# Patient Record
Sex: Female | Born: 1985 | Race: Black or African American | Hispanic: No | Marital: Married | Smoking: Current some day smoker
Health system: Southern US, Community
[De-identification: ages and names within clinical notes are randomized; demographics above are authoritative.]

## PROBLEM LIST (undated history)

## (undated) DIAGNOSIS — Z8041 Family history of malignant neoplasm of ovary: Secondary | ICD-10-CM

## (undated) DIAGNOSIS — Z803 Family history of malignant neoplasm of breast: Secondary | ICD-10-CM

## (undated) DIAGNOSIS — J45909 Unspecified asthma, uncomplicated: Secondary | ICD-10-CM

## (undated) HISTORY — DX: Family history of malignant neoplasm of breast: Z80.3

## (undated) HISTORY — DX: Unspecified asthma, uncomplicated: J45.909

## (undated) HISTORY — PX: TUBAL LIGATION: SHX77

## (undated) HISTORY — PX: LEEP: SHX91

## (undated) HISTORY — DX: Family history of malignant neoplasm of ovary: Z80.41

---

## 2012-11-10 DIAGNOSIS — D649 Anemia, unspecified: Secondary | ICD-10-CM | POA: Insufficient documentation

## 2012-11-10 DIAGNOSIS — E559 Vitamin D deficiency, unspecified: Secondary | ICD-10-CM | POA: Insufficient documentation

## 2015-03-13 DIAGNOSIS — G8929 Other chronic pain: Secondary | ICD-10-CM | POA: Insufficient documentation

## 2015-03-13 DIAGNOSIS — M546 Pain in thoracic spine: Secondary | ICD-10-CM | POA: Insufficient documentation

## 2019-04-09 ENCOUNTER — Other Ambulatory Visit: Payer: Self-pay

## 2019-04-09 ENCOUNTER — Encounter: Payer: Self-pay | Admitting: Physician Assistant

## 2019-04-09 ENCOUNTER — Ambulatory Visit (INDEPENDENT_AMBULATORY_CARE_PROVIDER_SITE_OTHER): Payer: No Typology Code available for payment source | Admitting: Physician Assistant

## 2019-04-09 VITALS — BP 123/84 | HR 74 | Temp 96.9°F | Ht 63.0 in | Wt 202.0 lb

## 2019-04-09 DIAGNOSIS — R12 Heartburn: Secondary | ICD-10-CM | POA: Diagnosis not present

## 2019-04-09 DIAGNOSIS — O3440 Maternal care for other abnormalities of cervix, unspecified trimester: Secondary | ICD-10-CM

## 2019-04-09 DIAGNOSIS — Z803 Family history of malignant neoplasm of breast: Secondary | ICD-10-CM

## 2019-04-09 DIAGNOSIS — F329 Major depressive disorder, single episode, unspecified: Secondary | ICD-10-CM | POA: Diagnosis not present

## 2019-04-09 DIAGNOSIS — Z9889 Other specified postprocedural states: Secondary | ICD-10-CM

## 2019-04-09 DIAGNOSIS — D649 Anemia, unspecified: Secondary | ICD-10-CM

## 2019-04-09 DIAGNOSIS — F32A Depression, unspecified: Secondary | ICD-10-CM

## 2019-04-09 DIAGNOSIS — Z13220 Encounter for screening for lipoid disorders: Secondary | ICD-10-CM

## 2019-04-09 DIAGNOSIS — R002 Palpitations: Secondary | ICD-10-CM

## 2019-04-09 NOTE — Progress Notes (Signed)
Patient: Desiree RumbleDanica Collins, Female    DOB: May 14, 1985, 33 y.o.   MRN: [id number] Visit Date: 04/15/2019  Today's Provider: Trey SailorsAdriana M Rhina Kramme, PA-C   Chief Complaint  Patient presents with  . Establish Care  . Gastroesophageal Reflux   Subjective:     Establish Care Desiree MangoDanica Laural BenesJohnson is a 33 y.o. female who presents today to establish. She feels fairly well. She reports exercising. She reports she is sleeping poorly.  Recently moving from [place], [place]. Lives with three children and works for [employer].   Patient reports a history of abnormal heartbeats for about 5 years ago. Nearly 3 years ago she had a loop recorder with cardiologist. She had tilt table test which was not revealing. Couldn't find out what was causing it, she was placed on metoprolol succinate and diltiazem which patient reported dropped her BP too low and she felt dizziness with this.   GERD:  Taking Nexium OTC, seems to be helping some.  Drinking 2 drinks most nights, mixture of vodka and tequila. PAP smear last year in [place] which she thinks was slightly abnormal and needed repeating this year. She had a LEEP at 16. She would like an IUD.   Depression: Reports history of depression diagnosed several years ago. She was prescribed a medication she cannot remember and took this for two days but discontinued because she didn't like the side effects. Thinks this may be influencing her drinking habits.     Office Visit from 04/09/2019 in [facility]  PHQ-9 Total Score  11      -----------------------------------------------------------------   Recently from [place] moved here three months ago.    Review of Systems  Constitutional: Positive for diaphoresis, fatigue, fever and unexpected weight change. Negative for activity change, appetite change and chills.  HENT: Positive for congestion and sinus pressure. Negative for dental problem, drooling, ear discharge, ear pain, facial swelling,  hearing loss, mouth sores, nosebleeds, postnasal drip, rhinorrhea, sinus pain, sneezing, sore throat, tinnitus, trouble swallowing and voice change.   Eyes: Negative.   Respiratory: Negative.   Cardiovascular: Positive for palpitations. Negative for chest pain and leg swelling.  Gastrointestinal: Negative.   Endocrine: Positive for polydipsia. Negative for cold intolerance, heat intolerance, polyphagia and polyuria.  Genitourinary: Negative.   Musculoskeletal: Positive for back pain. Negative for arthralgias, gait problem, joint swelling, myalgias, neck pain and neck stiffness.  Skin: Negative.   Allergic/Immunologic: Negative.   Neurological: Positive for headaches. Negative for dizziness, tremors, seizures, syncope, facial asymmetry, speech difficulty, weakness, light-headedness and numbness.  Hematological: Negative.   Psychiatric/Behavioral: Positive for decreased concentration. Negative for agitation, behavioral problems, confusion, dysphoric mood, hallucinations, self-injury, sleep disturbance and suicidal ideas. The patient is not nervous/anxious and is not hyperactive.     Social History      She  reports that she has never smoked. She has never used smokeless tobacco. She reports current alcohol use. She reports that she does not use drugs.       Social History   Socioeconomic History  . Marital status: Married    Spouse name: Not on file  . Number of children: Not on file  . Years of education: Not on file  . Highest education level: Not on file  Occupational History  . Not on file  Tobacco Use  . Smoking status: Never Smoker  . Smokeless tobacco: Never Used  Substance and Sexual Activity  . Alcohol use: Yes  . Drug use: Never  . Sexual  activity: Not on file  Other Topics Concern  . Not on file  Social History Narrative  . Not on file   Social Determinants of Health   Financial Resource Strain:   . Difficulty of Paying Living Expenses: Not on file  Food  Insecurity:   . Worried About Programme researcher, broadcasting/film/video in the Last Year: Not on file  . Ran Out of Food in the Last Year: Not on file  Transportation Needs:   . Lack of Transportation (Medical): Not on file  . Lack of Transportation (Non-Medical): Not on file  Physical Activity:   . Days of Exercise per Week: Not on file  . Minutes of Exercise per Session: Not on file  Stress:   . Feeling of Stress : Not on file  Social Connections:   . Frequency of Communication with Friends and Family: Not on file  . Frequency of Social Gatherings with Friends and Family: Not on file  . Attends Religious Services: Not on file  . Active Member of Clubs or Organizations: Not on file  . Attends Banker Meetings: Not on file  . Marital Status: Not on file    Past Medical History:  Diagnosis Date  . Asthma      Patient Active Problem List   Diagnosis Date Noted  . Palpitations 04/12/2019    Past Surgical History:  Procedure Laterality Date  . LEEP    . TUBAL LIGATION      Family History        Family Status  Relation Name Status  . Mother  Alive  . Father  (Not Specified)  . MGM  Alive  . MGF  Deceased  . Mat Aunt  Alive        Her family history includes Bone cancer in her maternal grandfather; Breast cancer in her maternal aunt, maternal grandmother, and mother; Cervical cancer in her maternal grandmother; Diabetes in her mother; Hypertension in her father; Uterine cancer in her mother.      Allergies  Allergen Reactions  . Chocolate Hives  . Morphine Hives     Current Outpatient Medications:  .  esomeprazole (NEXIUM) 20 MG capsule, Take 20 mg by mouth daily at 12 noon., Disp: , Rfl:    Patient Care Team: Maryella Shivers as PCP - General (Physician Assistant)    Objective:    Vitals: BP 123/84 (BP Location: Left Arm, Patient Position: Sitting, Cuff Size: Large)   Pulse 74   Temp (!) 96.9 F (36.1 C) (Temporal)   Ht 5\' 3"  (1.6 m)   Wt 202 lb (91.6  kg)   BMI 35.78 kg/m    Vitals:   04/09/19 1015  BP: 123/84  Pulse: 74  Temp: (!) 96.9 F (36.1 C)  TempSrc: Temporal  Weight: 202 lb (91.6 kg)  Height: 5\' 3"  (1.6 m)     Physical Exam Constitutional:      Appearance: Normal appearance.  Cardiovascular:     Rate and Rhythm: Normal rate and regular rhythm.     Heart sounds: Normal heart sounds.  Pulmonary:     Effort: Pulmonary effort is normal.     Breath sounds: Normal breath sounds.  Skin:    General: Skin is warm and dry.  Neurological:     Mental Status: She is alert and oriented to person, place, and time. Mental status is at baseline.  Psychiatric:        Mood and Affect: Mood normal.  Behavior: Behavior normal.      Depression Screen PHQ 2/9 Scores 04/09/2019  PHQ - 2 Score 3  PHQ- 9 Score 11       Assessment & Plan:     Routine Health Maintenance and Physical Exam  Exercise Activities and Dietary recommendations Goals   None      There is no immunization history on file for this patient.  Health Maintenance  Topic Date Due  . HIV Screening  09/07/1998  . TETANUS/TDAP  09/07/2002  . PAP SMEAR-Modifier  09/06/2004  . INFLUENZA VACCINE  07/28/2019 (Originally 11/28/2018)     Discussed health benefits of physical activity, and encouraged her to engage in regular exercise appropriate for her age and condition.    1. History of loop electrosurgical excision procedure (LEEP) of cervix affecting pregnancy, antepartum  Refer for follow up PAP, IUD an for strong family history of breast cancer.   - Ambulatory referral to Obstetrics / Gynecology  2. Family history of breast cancer  - Ambulatory referral to Obstetrics / Gynecology  3. Depression, unspecified depression type  Patient does not desire treatment or counseling at this time.   4. Heartburn  Avoid triggers and continue nexium.  5. Anemia, unspecified type  - CBC with Differential - TSH - Comprehensive Metabolic Panel  (CMET) - Lipid Profile  6. Screening, lipid  - Lipid Profile  7. Palpitations  Counseled on general nature of palpitations. Counseled that blood pressure medications are given to control heart rate. Patient seems to have had extensive workup previously with no findings. Patient would like referral to cardiology. Counseled patient that there may no be additional findings and recommendations are likely to be beta blockers and calcium channel blockers. Patient prefers to proceed with referral.  - Ambulatory referral to Cardiology  The entirety of the information documented in the History of Present Illness, Review of Systems and Physical Exam were personally obtained by me. Portions of this information were initially documented by [facility] and reviewed by me for thoroughness and accuracy.   --------------------------------------------------------------------    Trinna Post, PA-C  [facility]

## 2019-04-09 NOTE — Patient Instructions (Signed)
Health Maintenance, Female Adopting a healthy lifestyle and getting preventive care are important in promoting health and wellness. Ask your health care provider about:  The right schedule for you to have regular tests and exams.  Things you can do on your own to prevent diseases and keep yourself healthy. What should I know about diet, weight, and exercise? Eat a healthy diet   Eat a diet that includes plenty of vegetables, fruits, low-fat dairy products, and lean protein.  Do not eat a lot of foods that are high in solid fats, added sugars, or sodium. Maintain a healthy weight Body mass index (BMI) is used to identify weight problems. It estimates body fat based on height and weight. Your health care provider can help determine your BMI and help you achieve or maintain a healthy weight. Get regular exercise Get regular exercise. This is one of the most important things you can do for your health. Most adults should:  Exercise for at least 150 minutes each week. The exercise should increase your heart rate and make you sweat (moderate-intensity exercise).  Do strengthening exercises at least twice a week. This is in addition to the moderate-intensity exercise.  Spend less time sitting. Even light physical activity can be beneficial. Watch cholesterol and blood lipids Have your blood tested for lipids and cholesterol at 35 years of age, then have this test every 5 years. Have your cholesterol levels checked more often if:  Your lipid or cholesterol levels are high.  You are older than 35 years of age.  You are at high risk for heart disease. What should I know about cancer screening? Depending on your health history and family history, you may need to have cancer screening at various ages. This may include screening for:  Breast cancer.  Cervical cancer.  Colorectal cancer.  Skin cancer.  Lung cancer. What should I know about heart disease, diabetes, and high blood  pressure? Blood pressure and heart disease  High blood pressure causes heart disease and increases the risk of stroke. This is more likely to develop in people who have high blood pressure readings, are of African descent, or are overweight.  Have your blood pressure checked: ? Every 3-5 years if you are 18-39 years of age. ? Every year if you are 40 years old or older. Diabetes Have regular diabetes screenings. This checks your fasting blood sugar level. Have the screening done:  Once every three years after age 40 if you are at a normal weight and have a low risk for diabetes.  More often and at a younger age if you are overweight or have a high risk for diabetes. What should I know about preventing infection? Hepatitis B If you have a higher risk for hepatitis B, you should be screened for this virus. Talk with your health care provider to find out if you are at risk for hepatitis B infection. Hepatitis C Testing is recommended for:  Everyone born from 1945 through 1965.  Anyone with known risk factors for hepatitis C. Sexually transmitted infections (STIs)  Get screened for STIs, including gonorrhea and chlamydia, if: ? You are sexually active and are younger than 35 years of age. ? You are older than 35 years of age and your health care provider tells you that you are at risk for this type of infection. ? Your sexual activity has changed since you were last screened, and you are at increased risk for chlamydia or gonorrhea. Ask your health care provider if   you are at risk.  Ask your health care provider about whether you are at high risk for HIV. Your health care provider may recommend a prescription medicine to help prevent HIV infection. If you choose to take medicine to prevent HIV, you should first get tested for HIV. You should then be tested every 3 months for as long as you are taking the medicine. Pregnancy  If you are about to stop having your period (premenopausal) and  you may become pregnant, seek counseling before you get pregnant.  Take 400 to 800 micrograms (mcg) of folic acid every day if you become pregnant.  Ask for birth control (contraception) if you want to prevent pregnancy. Osteoporosis and menopause Osteoporosis is a disease in which the bones lose minerals and strength with aging. This can result in bone fractures. If you are 65 years old or older, or if you are at risk for osteoporosis and fractures, ask your health care provider if you should:  Be screened for bone loss.  Take a calcium or vitamin D supplement to lower your risk of fractures.  Be given hormone replacement therapy (HRT) to treat symptoms of menopause. Follow these instructions at home: Lifestyle  Do not use any products that contain nicotine or tobacco, such as cigarettes, e-cigarettes, and chewing tobacco. If you need help quitting, ask your health care provider.  Do not use street drugs.  Do not share needles.  Ask your health care provider for help if you need support or information about quitting drugs. Alcohol use  Do not drink alcohol if: ? Your health care provider tells you not to drink. ? You are pregnant, may be pregnant, or are planning to become pregnant.  If you drink alcohol: ? Limit how much you use to 0-1 drink a day. ? Limit intake if you are breastfeeding.  Be aware of how much alcohol is in your drink. In the [place] one drink equals one 12 oz bottle of beer (355 mL), one 5 oz glass of wine (148 mL), or one 1 oz glass of hard liquor (44 mL). General instructions  Schedule regular health, dental, and eye exams.  Stay current with your vaccines.  Tell your health care provider if: ? You often feel depressed. ? You have ever been abused or do not feel safe at home. Summary  Adopting a healthy lifestyle and getting preventive care are important in promoting health and wellness.  Follow your health care provider's instructions about healthy  diet, exercising, and getting tested or screened for diseases.  Follow your health care provider's instructions on monitoring your cholesterol and blood pressure. This information is not intended to replace advice given to you by your health care provider. Make sure you discuss any questions you have with your health care provider. Document Released: 10/29/2010 Document Revised: 04/08/2018 Document Reviewed: 04/08/2018 Elsevier Patient Education  2020 Elsevier Inc.  

## 2019-04-10 LAB — COMPREHENSIVE METABOLIC PANEL
ALT: 18 IU/L (ref 0–32)
AST: 20 IU/L (ref 0–40)
Albumin/Globulin Ratio: 2 (ref 1.2–2.2)
Albumin: 4.1 g/dL (ref 3.8–4.8)
Alkaline Phosphatase: 56 IU/L (ref 39–117)
BUN/Creatinine Ratio: 15 (ref 9–23)
BUN: 13 mg/dL (ref 6–20)
Bilirubin Total: 0.3 mg/dL (ref 0.0–1.2)
CO2: 24 mmol/L (ref 20–29)
Calcium: 9 mg/dL (ref 8.7–10.2)
Chloride: 105 mmol/L (ref 96–106)
Creatinine, Ser: 0.84 mg/dL (ref 0.57–1.00)
GFR calc Af Amer: 104 mL/min/{1.73_m2} (ref >59)
GFR calc non Af Amer: 90 mL/min/{1.73_m2} (ref >59)
Globulin, Total: 2.1 g/dL (ref 1.5–4.5)
Glucose: 88 mg/dL (ref 65–99)
Potassium: 4.2 mmol/L (ref 3.5–5.2)
Sodium: 138 mmol/L (ref 134–144)
Total Protein: 6.2 g/dL (ref 6.0–8.5)

## 2019-04-10 LAB — CBC WITH DIFFERENTIAL/PLATELET
Basophils Absolute: 0 10*3/uL (ref 0.0–0.2)
Basos: 1 %
EOS (ABSOLUTE): 0.1 10*3/uL (ref 0.0–0.4)
Eos: 2 %
Hematocrit: 36.1 % (ref 34.0–46.6)
Hemoglobin: 11.5 g/dL (ref 11.1–15.9)
Immature Grans (Abs): 0 10*3/uL (ref 0.0–0.1)
Immature Granulocytes: 0 %
Lymphocytes Absolute: 1.8 10*3/uL (ref 0.7–3.1)
Lymphs: 33 %
MCH: 27.2 pg (ref 26.6–33.0)
MCHC: 31.9 g/dL (ref 31.5–35.7)
MCV: 85 fL (ref 79–97)
Monocytes Absolute: 0.4 10*3/uL (ref 0.1–0.9)
Monocytes: 8 %
Neutrophils Absolute: 3 10*3/uL (ref 1.4–7.0)
Neutrophils: 56 %
Platelets: 339 10*3/uL (ref 150–450)
RBC: 4.23 x10E6/uL (ref 3.77–5.28)
RDW: 14.8 % (ref 11.7–15.4)
WBC: 5.4 10*3/uL (ref 3.4–10.8)

## 2019-04-10 LAB — LIPID PANEL
Chol/HDL Ratio: 3.6 ratio (ref 0.0–4.4)
Cholesterol, Total: 203 mg/dL — ABNORMAL HIGH (ref 100–199)
HDL: 56 mg/dL (ref >39)
LDL Chol Calc (NIH): 132 mg/dL — ABNORMAL HIGH (ref 0–99)
Triglycerides: 81 mg/dL (ref 0–149)
VLDL Cholesterol Cal: 15 mg/dL (ref 5–40)

## 2019-04-10 LAB — TSH: TSH: 1.21 u[IU]/mL (ref 0.450–4.500)

## 2019-04-12 DIAGNOSIS — R002 Palpitations: Secondary | ICD-10-CM | POA: Insufficient documentation

## 2019-04-12 NOTE — Progress Notes (Signed)
Cardiology Office Note  Date:  04/13/2019   ID:  Desiree Collins, DOB Nov 16, 1985, MRN [id number]  PCP:  Trinna Post, PA-C   Chief Complaint  Patient presents with  . New Patient (Initial Visit)    referred by PCP for abnormal Heartbeats. Patient also has a loop recorder. Meds reviewed verbally with patient.     HPI:  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  PSH:    Past Surgical History:  Procedure Laterality Date  . LEEP    . TUBAL LIGATION      Current Outpatient Medications  Medication Sig Dispense Refill  . esomeprazole (NEXIUM) 20 MG capsule Take 20 mg by mouth daily at 12 noon.     No current facility-administered medications for this visit.     Allergies:   Chocolate and Morphine   Social History:  The patient  reports that she has never smoked. She has never used smokeless tobacco. She reports current alcohol use. She reports that she does not use drugs.   Family History:   family history includes Bone cancer in her  maternal grandfather; Breast cancer in her maternal aunt, maternal grandmother, and mother; Cervical cancer in her maternal grandmother; Diabetes in her mother; Hypertension in her father; Uterine cancer in her mother.    Review of Systems: Review of Systems  Constitutional: Negative.   HENT: Negative.   Respiratory: Negative.   Cardiovascular: Positive for palpitations.  Gastrointestinal: Negative.   Musculoskeletal: Negative.   Neurological: Negative.   Psychiatric/Behavioral: Negative.   All other systems reviewed and are negative.   PHYSICAL EXAM: VS:  BP 110/70 (BP Location: Left Arm, Patient Position: Sitting, Cuff Size: Normal)   Pulse 76   Ht 5\' 3"  (1.6 m)   Wt 201 lb (91.2 kg)   SpO2 99%   BMI 35.61 kg/m  , BMI Body mass index is 35.61 kg/m. GEN: Well nourished, well developed, in no acute distress HEENT: normal Neck: no JVD, carotid bruits, or masses Cardiac: RRR; no murmurs, rubs, or gallops,no edema  Respiratory:  clear to auscultation bilaterally, normal work of breathing GI: soft, nontender, nondistended, + BS MS: no deformity or atrophy Skin: warm and dry, no rash Neuro:  Strength and sensation are intact Psych: euthymic mood, full affect   Recent Labs: 04/09/2019: ALT 18; BUN 13; Creatinine, Ser 0.84; Hemoglobin 11.5; Platelets 339; Potassium 4.2; Sodium 138; TSH 1.210    Lipid Panel Lab Results  Component Value Date   CHOL 203 (H) 04/09/2019   HDL 56 04/09/2019   LDLCALC 132 (H) 04/09/2019  TRIG 81 04/09/2019      Wt Readings from Last 3 Encounters:  04/13/19 201 lb (91.2 kg)  04/09/19 202 lb (91.6 kg)     ASSESSMENT AND PLAN:  Problem List Items Addressed This Visit    Palpitations   Relevant Orders   EKG 12-Lead   LONG TERM MONITOR (3-14 DAYS)     Episodes of her short tachycardia with strong beats is unclear Unable to exclude short runs of atrial tachycardia or SVT, PACs or PVCs Recommended a Zio monitor for further  clarification Discussed various treatment options with her including propranolol as needed, low-dose metoprolol on a daily basis, other beta-blockers, even calcium channel blockers -Would hope not to advance to antiarrhythmics if possible Blood pressure is low which may limit treatment options  We will try to obtain prior records for our system Disposition:   F/U  She preferred to be called with the results of her monitor Rather than a scheduled visit   Patient seen in consultation for Osvaldo Angst be referred back to her office for ongoing care of the issues detailed above   Total encounter time more than 60 minutes  Greater than 50% was spent in counseling and coordination of care with the patient    Signed, Dossie Arbour, M.D., Ph.D. [facility] [place], [place] [phone]

## 2019-04-13 ENCOUNTER — Telehealth: Payer: Self-pay

## 2019-04-13 ENCOUNTER — Ambulatory Visit (INDEPENDENT_AMBULATORY_CARE_PROVIDER_SITE_OTHER): Payer: No Typology Code available for payment source | Admitting: Cardiovascular Disease

## 2019-04-13 ENCOUNTER — Other Ambulatory Visit: Payer: Self-pay

## 2019-04-13 ENCOUNTER — Other Ambulatory Visit: Payer: No Typology Code available for payment source

## 2019-04-13 ENCOUNTER — Encounter: Payer: Self-pay | Admitting: Cardiovascular Disease

## 2019-04-13 VITALS — BP 110/70 | HR 76 | Ht 63.0 in | Wt 201.0 lb

## 2019-04-13 DIAGNOSIS — R002 Palpitations: Secondary | ICD-10-CM

## 2019-04-13 DIAGNOSIS — I479 Paroxysmal tachycardia, unspecified: Secondary | ICD-10-CM | POA: Diagnosis not present

## 2019-04-13 NOTE — Telephone Encounter (Signed)
-----   Message from Trinna Post, [place] sent at 04/12/2019  1:28 PM EST ----- Labwork all looks stable. Please have her make [place] and send PAP smear results when she is able.

## 2019-04-13 NOTE — Patient Instructions (Signed)
Zio for paroxysmal tachycardia, palpitations  Medication Instructions:  No changes  If you need a refill on your cardiac medications before your next appointment, please call your pharmacy.    Lab work: No new labs needed   If you have labs (blood work) drawn today and your tests are completely normal, you will receive your results only by: Marland Kitchen MyChart Message (if you have MyChart) OR . A paper copy in the mail If you have any lab test that is abnormal or we need to change your treatment, we will call you to review the results.   Testing/Procedures: No new testing needed   Follow-Up: At [facility], you and your health needs are our priority.  As part of our continuing mission to provide you with exceptional heart care, we have created designated Provider Care Teams.  These Care Teams include your primary Cardiologist (physician) and Advanced Practice Providers (APPs -  Physician Assistants and Nurse Practitioners) who all work together to provide you with the care you need, when you need it.  . You will need a follow up appointment as needed . We will call you with results   . Providers on your designated Care Team:   . Murray Hodgkins, NP . Christell Faith, PA-C . Marrianne Mood, PA-C  Any Other Special Instructions Will Be Listed Below (If Applicable).  For educational health videos Log in to : [email or web address] Or : [email or web address], password : [email or web address]

## 2019-04-13 NOTE — Telephone Encounter (Signed)
Pt advised.   Thanks,   -Lanesha Azzaro  

## 2019-04-13 NOTE — Telephone Encounter (Signed)
-----   Message from Adriana M Pollak, PA-C sent at 04/12/2019  1:28 PM EST ----- Labwork all looks stable. Please have her make MYChart and send PAP smear results when she is able.  

## 2019-04-15 ENCOUNTER — Encounter: Payer: Self-pay | Admitting: Physician Assistant

## 2019-05-05 ENCOUNTER — Encounter: Payer: No Typology Code available for payment source | Admitting: Obstetrics and Gynecology

## 2019-05-10 ENCOUNTER — Telehealth: Payer: Self-pay

## 2019-05-21 ENCOUNTER — Encounter: Payer: Self-pay | Admitting: Obstetrics and Gynecology

## 2019-05-21 ENCOUNTER — Other Ambulatory Visit: Payer: Self-pay

## 2019-05-21 ENCOUNTER — Other Ambulatory Visit (HOSPITAL_COMMUNITY)
Admission: RE | Admit: 2019-05-21 | Discharge: 2019-05-21 | Disposition: A | Discharge status: Home / Self Care | Payer: No Typology Code available for payment source | Source: Ambulatory Visit | Attending: Obstetrics and Gynecology | Admitting: Obstetrics and Gynecology

## 2019-05-21 ENCOUNTER — Ambulatory Visit (INDEPENDENT_AMBULATORY_CARE_PROVIDER_SITE_OTHER): Payer: No Typology Code available for payment source | Admitting: Obstetrics and Gynecology

## 2019-05-21 VITALS — BP 114/70 | Ht 63.0 in | Wt 208.0 lb

## 2019-05-21 DIAGNOSIS — N926 Irregular menstruation, unspecified: Secondary | ICD-10-CM | POA: Diagnosis not present

## 2019-05-21 DIAGNOSIS — Z124 Encounter for screening for malignant neoplasm of cervix: Secondary | ICD-10-CM

## 2019-05-21 DIAGNOSIS — Z803 Family history of malignant neoplasm of breast: Secondary | ICD-10-CM

## 2019-05-21 NOTE — Patient Instructions (Signed)
° °Breast Cancer, Female ° °Breast cancer is a malignant growth of tissue (tumor) in the breast. Unlike noncancerous (benign) tumors, malignant tumors are cancerous and can spread to other parts of the body. The two most common types of breast cancer start in the milk ducts (ductal carcinoma) or in the lobules where milk is made in the breast (lobular carcinoma). Breast cancer is one of the most common types of cancer in women. °What are the causes? °The exact cause of female breast cancer is unknown. °What increases the risk? °The following factors may make you more likely to develop this condition: °· Being older than 36 years of age. °· Race and ethnicity. Caucasian women generally have an increased risk, but African-American women are more likely to develop the disease before age 45. °· Having a family history of breast cancer. °· Having had breast cancer in the past. °· Having certain noncancerous conditions of the breast, such as dense breast tissue. °· Having the BRCA1 and BRCA2 genes. °· Having a history of radiation exposure. °· Obesity. °· Starting menopause after age 55. °· Starting your menstrual periods before age 12. °· Having never been pregnant or having your first child after age 30. °· Having never breastfed. °· Using hormone therapy after menopause. °· Using birth control pills. °· Drinking more than one alcoholic drink a day. °· Exposure to the drug DES, which was given to pregnant women from the 1940s to the 1970s. °What are the signs or symptoms? °Symptoms of this condition include: °· A painless lump or thickening in your breast. °· Changes in the size or shape of your breast. °· Breast skin changes, such as puckering or dimpling. °· Nipple abnormalities, such as scaling, crustiness, redness, or pulling in (retraction). °· Nipple discharge that is bloody or clear. °How is this diagnosed? °This condition may be diagnosed by: °· Taking your medical history and doing a physical exam. During the  exam, your health care provider will feel the tissue around your breast and under your arms. °· Taking a sample of nipple discharge. The sample will be examined under a microscope. °· Performing imaging tests, such as breast X-rays (mammogram), breast ultrasound exams, or an MRI. °· Taking a tissue sample (biopsy) from the breast. The sample will be examined under a microscope to look for cancer cells. °· Taking a sample from the lymph nodes near the affected breast (sentinel node biopsy). °Your cancer will be staged to determine its severity and extent. Staging is a careful attempt to find out the size of the tumor, whether the cancer has spread, and if so, to what parts of the body. Staging also includes testing your tumor for certain receptors, such as estrogen, progesterone, and human epidermal growth factor receptor 2 (HER2). This will help your cancer care team decide on a treatment that will work best for you. You may need to have more tests to determine the stage of your cancer. Stages include the following: °· Stage 0--The tumor has not spread to other breast tissue. °· Stage I--The cancer is only found in the breast or may be in the lymph nodes. The tumor may be up to ¾ in (2 cm) wide. °· Stage II--The cancer has spread to nearby lymph nodes. The tumor may be up to 2 in (5 cm) wide. °· Stage III--The cancer has spread to more distant lymph nodes. The tumor may be larger than 2 in (5 cm) wide. °· Stage IV--The cancer has spread to other parts   the body, such as the bones, brain, liver, or lungs. How is this treated? Treatment for this condition depends on the type and stage of the breast cancer. It may be treated with:  Surgery. This may involve breast-conserving surgery (lumpectomy or partial mastectomy) in which only the part of the breast containing the cancer is removed. Some normal tissue surrounding this area may also be removed. In some cases, surgery may be done to remove the entire breast  (mastectomy) and nipple. Lymph nodes may also be removed.  Radiation therapy, which uses high-energy rays to kill cancer cells.  Chemotherapy, which is the use of drugs to kill cancer cells.  Hormone therapy, which involves taking medicine to adjust the hormone levels in your body. You may take medicine to decrease your estrogen levels. This can help stop cancer cells from growing.  Targeted therapy, in which drugs are used to block the growth and spread of cancer cells. These drugs target a specific part of the cancer cell and usually cause fewer side effects than chemotherapy. Targeted therapy may be used alone or in combination with chemotherapy.  A combination of surgery, radiation, chemotherapy, or hormone therapy may be needed to treat breast cancer. Follow these instructions at home:  Take over-the-counter and prescription medicines only as told by your health care provider.  Eat a healthy diet. A healthy diet includes lots of fruits and vegetables, low-fat dairy products, lean meats, and fiber. ? Make sure half your plate is filled with fruits or vegetables. ? Choose high-fiber foods such as whole-grain breads and cereals.  Consider joining a support group. This may help you learn to cope with the stress of having breast cancer.  Talk to your health care team about exercise and physical activity. The right exercise program can: ? Help prevent or reduce symptoms such as fatigue or depression. ? Improve overall health and survival rates.  Keep all follow-up visits as told by your health care provider. This is important. Where to find more information  American Cancer Society: www.cancer.[place]: www.cancer.gov Contact a health care provider if:  You have a sudden increase in pain.  You have any symptoms or changes that concern you.  You lose weight without trying.  You notice a new lump in either breast or under your arm.  You develop swelling in  either arm or hand.  You have a fever.  You notice new fatigue or weakness. Get help right away if:  You have chest pain or trouble breathing.  You faint. Summary  Breast cancer is a malignant growth of tissue (tumor) in the breast.  Your cancer will be staged to determine its severity and extent.  Treatment for this condition depends on the type and stage of the breast cancer. This information is not intended to replace advice given to you by your health care provider. Make sure you discuss any questions you have with your health care provider. Document Revised: 03/28/2017 Document Reviewed: 12/09/2016 Elsevier Patient Education  [place].

## 2019-05-21 NOTE — Progress Notes (Signed)
Patient ID: Desiree Collins, female   DOB: [birth year], 34 y.o.   MRN: [id number]  Reason for Consult: Referral   Referred by Trinna Post, PA-C  Subjective:     HPI:  Desiree Collins is a 34 y.o. female.   Gynecological History Menarche: 12 LMP: unknown Describes periods as irregular. She will have several months of a regular period and then skip a 1-3 months in a row.  Last pap smear: 2019 Last Mammogram: never Sexually Active:yes, deneis dyspareunia Contraception: Used Depo for 5 years now has a tubal ligation  Obstetrical History  OB History  Gravida Para Term Preterm AB Living  '3 3 3     3  ' SAB TAB Ectopic Multiple Live Births          3    # Outcome Date GA Lbr Len/2nd Weight Sex Delivery Anes PTL Lv  3 Term 06/15/10   7 lb 14 oz (3.572 kg) F Vag-Spont   LIV  2 Term 03/29/05 [redacted]w[redacted]d 7 lb 14 oz (3.572 kg) F Vag-Spont   LIV  1 Term 03/14/04   6 lb 11 oz (3.033 kg) F Vag-Spont   LIV     Past Medical History:  Diagnosis Date  . Asthma    Family History  Problem Relation Age of Onset  . Diabetes Mother   . Breast cancer Mother 371      again at 570 . Ovarian cancer Mother 547 . Hypertension Father   . Breast cancer Maternal Grandmother 450      again at 754 . Cervical cancer Maternal Grandmother   . Bone cancer Maternal Grandfather   . Colon cancer Maternal Grandfather 635 . Breast cancer Maternal Aunt 52  . Breast cancer Paternal Grandmother   . Breast cancer Cousin   . Breast cancer Cousin    Past Surgical History:  Procedure Laterality Date  . LEEP    . TUBAL LIGATION      Short Social History:  Social History   Tobacco Use  . Smoking status: Never Smoker  . Smokeless tobacco: Never Used  Substance Use Topics  . Alcohol use: Yes    Allergies  Allergen Reactions  . Chocolate Hives  . Morphine Hives    Current Outpatient Medications  Medication Sig Dispense Refill  . esomeprazole (NEXIUM) 20 MG capsule Take 20 mg by mouth daily at  12 noon.     No current facility-administered medications for this visit.    Review of Systems  Constitutional: Negative for chills, fatigue, fever and unexpected weight change.  HENT: Negative for trouble swallowing.  Eyes: Negative for loss of vision.  Respiratory: Negative for cough, shortness of breath and wheezing.  Cardiovascular: Negative for chest pain, leg swelling, palpitations and syncope.  GI: Negative for abdominal pain, blood in stool, diarrhea, nausea and vomiting.  GU: Negative for difficulty urinating, dysuria, frequency and hematuria.  Musculoskeletal: Negative for back pain, leg pain and joint pain.  Skin: Negative for rash.  Neurological: Negative for dizziness, headaches, light-headedness, numbness and seizures.  Psychiatric: Negative for behavioral problem, confusion, depressed mood and sleep disturbance.        Objective:  Objective   Vitals:   05/21/19 1451  BP: 114/70  Weight: 208 lb (94.3 kg)  Height: '5\' 3"'  (1.6 m)   Body mass index is 36.85 kg/m.  Physical Exam Vitals and nursing note reviewed. Exam conducted with a chaperone present.  Constitutional:  Appearance: She is well-developed.  HENT:     Head: Normocephalic and atraumatic.  Cardiovascular:     Rate and Rhythm: Normal rate and regular rhythm.  Pulmonary:     Effort: Pulmonary effort is normal.     Breath sounds: Normal breath sounds.  Abdominal:     General: Bowel sounds are normal.     Palpations: Abdomen is soft.  Genitourinary:    General: Normal vulva.     Rectum: Normal.     Comments: External: Normal appearing vulva. No lesions noted.  Speculum examination: Normal appearing cervix. No blood in the vaginal vault. Scant white discharge.   Bimanual examination: Uterus midline, non-tender, normal in size, shape and contour.  No CMT. No adnexal masses. No adnexal tenderness. Pelvis not fixed.    Musculoskeletal:        General: Normal range of motion.  Skin:    General:  Skin is warm and dry.  Neurological:     Mental Status: She is alert and oriented to person, place, and time.  Psychiatric:        Behavior: Behavior normal.        Thought Content: Thought content normal.        Judgment: Judgment normal.    GALE RISK MODEL Personalized Results This tool cannot accurately calculate risk for women with a medical history of breast cancer, DCIS or LCIS. Other tools may be more appropriate for women with known mutations in either the BRCA1 or BRCA2 gene, or other hereditary syndromes associated with higher risks of breast cancer. See Other Risk Assessment Tools for more information. Although a patient's risk may be accurately estimated, these predictions do not allow one to say precisely which woman will develop breast cancer. Some women who do not develop breast cancer have higher risk estimates than those who do develop the disease. This tool was designed for use by health professionals. If you are not a health professional, you are encouraged to print these results and discuss them with your provider. The answers provided were used to estimate absolute risk of developing invasive breast cancer during the next 5-year period and up to age 13 (lifetime risk). Factors included: patient's personal medical and reproductive history and the history of breast cancer among her first-degree relatives (mother, sisters, daughters). For comparison, the tool also presents 5-year and lifetime risk estimates for a woman of the same age and race/ethnicity who is at average risk for developing breast cancer. 5--Year Risk of Developing Breast Cancer  Patient Risk 0.4% Average Risk 0.3% Based on the information provided, the patient's estimated risk for developing invasive breast cancer over the next 5 years is 0.4%, presented in red since hers is higher than the average risk of 0.3% (presented in blue) for women of the same age and race/ethnicity in the general [place]  population. Lifetime Risk of Developing Breast Cancer  Patient Risk 11.9% Average Risk 10.1% Based on the information provided, the woman's estimated risk for developing invasive breast cancer over her lifetime (to age 69) is 11.9%, presented in red since hers is higher than the average risk of 10.1% (presented in blue) for women of the same age and race/ethnicity in the general [place] population. Your Answers These results are based upon how you answered the following questions:  Questions: Answers: 1. Does the woman have a medical history of any breast cancer or of ductal carcinoma in situ (DCIS) or lobular carcinoma in situ (LCIS) or has she received previous radiation therapy to  the chest for treatment of Hodgkin lymphoma?  No 2. Does the woman have a mutation in either the BRCA1 or BRCA2 gene, or a diagnosis of a genetic syndrome that may be associated with elevated risk of breast cancer?  No 3. What is the patient's age?  53 4. What is the patient's race/ethnicity?  African American a. What is the sub race/ethnicity or place of birth?  n/a 5. Has the patient ever had a breast biopsy with a benign (not cancer) diagnosis?  No a. How many breast biopsies with a benign diagnosis has the patient had?  n/a b. Has the patient ever had a breast biopsy with atypical hyperplasia?  n/a 6. What was the woman's age at the time of her first menstrual period?  14 or older 7. What was the woman's age when she gave birth to her first child?  20-24 8. How many of the woman's first-degree relatives (mother, sisters, daughters) have had breast cancer?  One      Assessment/Plan:     34 yo [id number] 1. Family history of breast cancer. - Madaline Savage risk model is less than 20% lifetime risk. Will obtain myrisk testing 2. Irregular periods- will check labs and have patient return for a pelvic US 3. Hx irregular pap smears and LEEP as a teenager, 2019 NIL HPV negative pap in Care  everywhere. Will repeat today- reassured patient.   More than 15 minutes were spent face to face with the patient in the room with more than 50% of the time spent providing counseling and discussing the plan of management.    Adrian Prows MD [facility] 05/21/2019 3:45 PM

## 2019-05-25 ENCOUNTER — Other Ambulatory Visit: Payer: Self-pay | Admitting: Obstetrics and Gynecology

## 2019-05-25 LAB — DHEA-SULFATE

## 2019-05-25 LAB — FSH/LH

## 2019-05-25 LAB — TESTOSTERONE, FREE, TOTAL, SHBG

## 2019-05-25 LAB — 17-HYDROXYPROGESTERONE

## 2019-05-25 LAB — PROLACTIN

## 2019-05-26 LAB — CYTOLOGY - PAP
Chlamydia: NEGATIVE
Comment: NEGATIVE
Comment: NEGATIVE
Comment: NEGATIVE
Comment: NORMAL
Diagnosis: NEGATIVE
High risk HPV: NEGATIVE
Neisseria Gonorrhea: NEGATIVE
Trichomonas: NEGATIVE

## 2019-05-26 NOTE — Telephone Encounter (Signed)
TC with patient. Prelim + Syphilis, confirmatory negative.  Patient verbalized understanding Richmond Campbell, RN

## 2019-05-30 LAB — TESTOSTERONE, FREE, TOTAL, SHBG
Sex Hormone Binding: 41 nmol/L (ref 24.6–122.0)
Testosterone, Free: 1.5 pg/mL (ref 0.0–4.2)
Testosterone: 22 ng/dL (ref 8–48)

## 2019-05-30 LAB — FSH/LH
FSH: 8 m[IU]/mL
LH: 21.1 m[IU]/mL

## 2019-05-30 LAB — DHEA-SULFATE: DHEA-SO4: 227 ug/dL (ref 57.3–279.2)

## 2019-05-30 LAB — 17-HYDROXYPROGESTERONE: 17-Hydroxyprogesterone: 36 ng/dL

## 2019-05-30 LAB — PROLACTIN: Prolactin: 7.7 ng/mL (ref 4.8–23.3)

## 2019-05-31 DIAGNOSIS — Z1371 Encounter for nonprocreative screening for genetic disease carrier status: Secondary | ICD-10-CM

## 2019-05-31 DIAGNOSIS — Z9189 Other specified personal risk factors, not elsewhere classified: Secondary | ICD-10-CM

## 2019-05-31 HISTORY — DX: Other specified personal risk factors, not elsewhere classified: Z91.89

## 2019-05-31 HISTORY — DX: Encounter for nonprocreative screening for genetic disease carrier status: Z13.71

## 2019-06-07 ENCOUNTER — Encounter: Payer: Self-pay | Admitting: Obstetrics and Gynecology

## 2019-06-18 ENCOUNTER — Encounter: Payer: Self-pay | Admitting: Obstetrics and Gynecology

## 2019-06-18 ENCOUNTER — Ambulatory Visit (INDEPENDENT_AMBULATORY_CARE_PROVIDER_SITE_OTHER): Payer: No Typology Code available for payment source

## 2019-06-18 ENCOUNTER — Ambulatory Visit (INDEPENDENT_AMBULATORY_CARE_PROVIDER_SITE_OTHER): Payer: No Typology Code available for payment source | Admitting: Obstetrics and Gynecology

## 2019-06-18 ENCOUNTER — Other Ambulatory Visit: Payer: Self-pay

## 2019-06-18 ENCOUNTER — Other Ambulatory Visit: Payer: Self-pay | Admitting: Obstetrics and Gynecology

## 2019-06-18 VITALS — BP 112/70 | Ht 63.0 in | Wt 206.0 lb

## 2019-06-18 DIAGNOSIS — Z30011 Encounter for initial prescription of contraceptive pills: Secondary | ICD-10-CM

## 2019-06-18 DIAGNOSIS — N925 Other specified irregular menstruation: Secondary | ICD-10-CM

## 2019-06-18 DIAGNOSIS — N926 Irregular menstruation, unspecified: Secondary | ICD-10-CM

## 2019-06-18 DIAGNOSIS — Z1231 Encounter for screening mammogram for malignant neoplasm of breast: Secondary | ICD-10-CM | POA: Diagnosis not present

## 2019-06-18 DIAGNOSIS — N83291 Other ovarian cyst, right side: Secondary | ICD-10-CM

## 2019-06-18 DIAGNOSIS — E282 Polycystic ovarian syndrome: Secondary | ICD-10-CM

## 2019-06-18 DIAGNOSIS — R102 Pelvic and perineal pain: Secondary | ICD-10-CM

## 2019-06-18 DIAGNOSIS — N83202 Unspecified ovarian cyst, left side: Secondary | ICD-10-CM

## 2019-06-18 MED ORDER — LO LOESTRIN FE 1 MG-10 MCG / 10 MCG PO TABS: 1.0000 | ORAL_TABLET | Freq: Every day | ORAL | 3 refills | Status: DC

## 2019-06-18 NOTE — Progress Notes (Signed)
Patient ID: Desiree Collins, female   DOB: Aug 21, 1985, 34 y.o.   MRN: [id number]  Reason for Consult: Follow-up (U/S follow up, Myrisk results )   Referred by Trinna Post, PA-C  Subjective:     HPI:  Desiree Collins is a 34 y.o. female she is following up today for discussion of her my risk results and irregular menstrual period.  She has undergone an evaluation with pelvic ultrasound and lab work.  She reports that since I last saw her she has not had a menstrual period.  Her last menstrual cycle was in November.  Past Medical History:  Diagnosis Date  . Asthma   . BRCA negative 05/2019   MyRisk neg except AXIN2, BMPR1A, CHEK2, MSH3, POLE VUS  . Family history of breast cancer   . Family history of ovarian cancer   . Increased risk of breast cancer 05/2019   IBIS=23.7%   Family History  Problem Relation Age of Onset  . Diabetes Mother   . Breast cancer Mother 73       again at 12  . Ovarian cancer Mother 87  . Hypertension Father   . Breast cancer Maternal Grandmother 79       again at 18  . Cervical cancer Maternal Grandmother   . Bone cancer Maternal Grandfather   . Colon cancer Maternal Grandfather 56  . Breast cancer Maternal Aunt 52  . Breast cancer Paternal Grandmother   . Breast cancer Cousin   . Breast cancer Cousin    Past Surgical History:  Procedure Laterality Date  . LEEP    . TUBAL LIGATION      Short Social History:  Social History   Tobacco Use  . Smoking status: Never Smoker  . Smokeless tobacco: Never Used  Substance Use Topics  . Alcohol use: Yes    Allergies  Allergen Reactions  . Chocolate Hives  . Morphine Hives    Current Outpatient Medications  Medication Sig Dispense Refill  . esomeprazole (NEXIUM) 20 MG capsule Take 20 mg by mouth daily at 12 noon.    . Norethindrone-Ethinyl Estradiol-Fe Biphas (LO LOESTRIN FE) 1 MG-10 MCG / 10 MCG tablet Take 1 tablet by mouth daily. 84 tablet 3   No current facility-administered  medications for this visit.    Review of Systems  Constitutional: Negative for chills, fatigue, fever and unexpected weight change.  HENT: Negative for trouble swallowing.  Eyes: Negative for loss of vision.  Respiratory: Negative for cough, shortness of breath and wheezing.  Cardiovascular: Negative for chest pain, leg swelling, palpitations and syncope.  GI: Negative for abdominal pain, blood in stool, diarrhea, nausea and vomiting.  GU: Negative for difficulty urinating, dysuria, frequency and hematuria.  Musculoskeletal: Negative for back pain, leg pain and joint pain.  Skin: Negative for rash.  Neurological: Negative for dizziness, headaches, light-headedness, numbness and seizures.  Psychiatric: Negative for behavioral problem, confusion, depressed mood and sleep disturbance.        Objective:  Objective   Vitals:   06/18/19 0902  BP: 112/70  Weight: 206 lb (93.4 kg)  Height: '5\' 3"'  (1.6 m)   Body mass index is 36.49 kg/m.  Physical Exam Vitals and nursing note reviewed.  Constitutional:      Appearance: She is well-developed.  HENT:     Head: Normocephalic and atraumatic.  Eyes:     Pupils: Pupils are equal, round, and reactive to light.  Cardiovascular:     Rate and Rhythm: Normal rate and  regular rhythm.  Pulmonary:     Effort: Pulmonary effort is normal. No respiratory distress.  Skin:    General: Skin is warm and dry.  Neurological:     Mental Status: She is alert and oriented to person, place, and time.  Psychiatric:        Behavior: Behavior normal.        Thought Content: Thought content normal.        Judgment: Judgment normal.          Assessment/Plan:    34 year old  1. Elevated lifetime risk of breast cancer greater than 20%.  Will initiate annual mammograms and breast MRI orders placed this visit patient given a card to call and schedule appointments.  2.  Irregular menstrual cycle, secondary amenorrhea- meets criteria for PCOS, other  causes excluded. Discussed management with OCP. She has had headaches in the past with taking oral contraceptive pills.  She does not have a history of migraines or migraines with aura.  Will initiate low-dose oral contraceptive pill.  If this is not tolerated can switch to progesterone only method.  3.  Small ovarian cyst less than 3 cm in size complex in nature.  Likely ovulatory or hemorrhagic.  We will have patient follow-up in 12 weeks to repeat ultrasound.   More than 25 minutes were spent face to face with the patient in the room with more than 50% of the time spent providing counseling and discussing the plan of management.   Adrian Prows MD [facility] 06/18/2019 12:14 PM

## 2019-07-07 ENCOUNTER — Telehealth: Payer: Self-pay | Admitting: Obstetrics and Gynecology

## 2019-07-07 NOTE — Telephone Encounter (Signed)
Spoke to patient about getting her baseline mammogram. Patient is calling [facility] today to get it set up.

## 2019-07-09 ENCOUNTER — Other Ambulatory Visit: Payer: Self-pay | Admitting: Obstetrics and Gynecology

## 2019-07-09 DIAGNOSIS — Z1231 Encounter for screening mammogram for malignant neoplasm of breast: Secondary | ICD-10-CM

## 2019-07-09 NOTE — Telephone Encounter (Signed)
Patient is set up for her baseline mammogram at [facility] on 07/28/2019 @ 1:20pm

## 2019-07-28 ENCOUNTER — Ambulatory Visit
Admission: RE | Admit: 2019-07-28 | Discharge: 2019-07-28 | Disposition: A | Discharge status: Home / Self Care | Payer: No Typology Code available for payment source | Source: Ambulatory Visit | Attending: Obstetrics and Gynecology | Admitting: Obstetrics and Gynecology

## 2019-07-28 DIAGNOSIS — Z1231 Encounter for screening mammogram for malignant neoplasm of breast: Secondary | ICD-10-CM | POA: Insufficient documentation

## 2019-08-11 ENCOUNTER — Other Ambulatory Visit: Payer: Self-pay | Admitting: Obstetrics and Gynecology

## 2019-09-16 ENCOUNTER — Ambulatory Visit: Payer: No Typology Code available for payment source | Admitting: Obstetrics and Gynecology

## 2019-09-16 ENCOUNTER — Ambulatory Visit: Payer: No Typology Code available for payment source

## 2019-09-16 ENCOUNTER — Other Ambulatory Visit: Payer: No Typology Code available for payment source

## 2019-09-16 DIAGNOSIS — N83202 Unspecified ovarian cyst, left side: Secondary | ICD-10-CM

## 2019-09-22 ENCOUNTER — Encounter: Payer: Self-pay | Admitting: Physician Assistant

## 2019-09-22 ENCOUNTER — Other Ambulatory Visit: Payer: Self-pay

## 2019-09-22 ENCOUNTER — Ambulatory Visit (INDEPENDENT_AMBULATORY_CARE_PROVIDER_SITE_OTHER): Payer: No Typology Code available for payment source | Admitting: Physician Assistant

## 2019-09-22 VITALS — BP 128/83 | HR 76 | Temp 96.6°F | Wt 199.0 lb

## 2019-09-22 DIAGNOSIS — F320 Major depressive disorder, single episode, mild: Secondary | ICD-10-CM

## 2019-09-22 DIAGNOSIS — J45909 Unspecified asthma, uncomplicated: Secondary | ICD-10-CM | POA: Insufficient documentation

## 2019-09-22 DIAGNOSIS — I479 Paroxysmal tachycardia, unspecified: Secondary | ICD-10-CM | POA: Insufficient documentation

## 2019-09-22 MED ORDER — ESCITALOPRAM OXALATE 10 MG PO TABS: 10.0000 mg | ORAL_TABLET | Freq: Every day | ORAL | 0 refills | Status: DC
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
## 2019-09-23 ENCOUNTER — Ambulatory Visit (INDEPENDENT_AMBULATORY_CARE_PROVIDER_SITE_OTHER): Payer: No Typology Code available for payment source

## 2019-09-23 ENCOUNTER — Ambulatory Visit (INDEPENDENT_AMBULATORY_CARE_PROVIDER_SITE_OTHER): Payer: No Typology Code available for payment source | Admitting: Family

## 2019-09-23 ENCOUNTER — Encounter: Payer: Self-pay | Admitting: Family

## 2019-09-23 VITALS — BP 112/80 | HR 98 | Ht 63.0 in | Wt 200.5 lb

## 2019-09-23 DIAGNOSIS — R002 Palpitations: Secondary | ICD-10-CM

## 2019-09-23 DIAGNOSIS — Z8249 Family history of ischemic heart disease and other diseases of the circulatory system: Secondary | ICD-10-CM

## 2019-09-23 DIAGNOSIS — Z01810 Encounter for preprocedural cardiovascular examination: Secondary | ICD-10-CM

## 2019-09-23 DIAGNOSIS — R9431 Abnormal electrocardiogram [ECG] [EKG]: Secondary | ICD-10-CM | POA: Diagnosis not present

## 2019-09-23 DIAGNOSIS — R001 Bradycardia, unspecified: Secondary | ICD-10-CM

## 2019-09-23 DIAGNOSIS — R072 Precordial pain: Secondary | ICD-10-CM

## 2019-09-23 MED ORDER — PANTOPRAZOLE SODIUM 40 MG PO TBEC
40.0000 mg | DELAYED_RELEASE_TABLET | Freq: Every day | ORAL | 1 refills | Status: DC
Start: 2019-09-23 — End: 2019-10-26

## 2019-09-23 MED ORDER — METOPROLOL TARTRATE 100 MG PO TABS
100.0000 mg | ORAL_TABLET | Freq: Once | ORAL | 0 refills | Status: DC
Start: 2019-09-23 — End: 2019-11-24

## 2019-09-23 NOTE — Progress Notes (Addendum)
Office Visit    Patient Name: Desiree Collins Date of Encounter: 09/26/2019  Primary Care Provider:  Trinna Post, PA-C Primary Cardiologist:  Ida Rogue, MD Electrophysiologist:  None   Chief Complaint    Desiree Collins is a 34 y.o. female with a hx of palpitations, asthma, family history of premature coronary artery disease presents today for bradycardia in urgent care.   Past Medical History    Past Medical History:  Diagnosis Date  . Asthma   . BRCA negative 05/2019   MyRisk neg except AXIN2, BMPR1A, CHEK2, MSH3, POLE VUS  . Family history of breast cancer   . Family history of ovarian cancer   . Increased risk of breast cancer 05/2019   IBIS=23.7%   Past Surgical History:  Procedure Laterality Date  . LEEP    . TUBAL LIGATION      Allergies  Allergies  Allergen Reactions  . Chocolate Hives  . Morphine Hives    History of Present Illness    Desiree Collins is a 34 y.o. female with a hx of palpitations, asthma, family history of premature coronary disease last seen 03/2019 by Dr.Gollan.  Previous cardiac workup including loop recorder and tilt table were reportedly without acute findings. Wore ZIO monitor 04/13/19 with NSR, infrequent PVC/PAC  Has been feeling poorly for over 2 weeks. Reports chest pain, shortness of breath, feels she is being smothered. Was having bad back pain and this felt similar to previous to PNA. Flu and COVID swab were negative. Had been doing albuterol nebulizer at home and her regular inhaler.   Chest pain pain in the left side of the chest. Persistent for the last two weeks. Tried GasX, Tylenol, ibuprofen with no relief. Gets worse with deep breaths. Does have history of indigestion - just switched to Nexium which is starting not to help either. Feels like a sharp pain or heaviness which is different than her previous indigestion symptoms  Notes a nonproductive cough, but no wheeze. Reports no shortness of breath at rest.    HR at home 70s-80s when monitored on smart watch.  ECG showed sinus bradycardia 51bpm in Urgent Care per records. EKG unfortunately unavailable for my review. Reports on and off having lightheadedness and dizziness. Occurs mostly with changing positions and walking.  Tells me she thinks work is stressing her out and contributory to symptoms. Tells me palpitations in the past were infrequent and now more frequent. Feels like it "knocks the wind out of me". 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   Has not started birth control as prescribed by her OBGYN. Tells me the last time she was on it she had really bad headaches.   Tells me she has been weaning off of soda. Has been working out in [employer]. Tries to eat a healthy diet but limited by time as she has three daughters at home.  EKGs/Labs/Other Studies Reviewed:   The following studies were reviewed today:  EKG:  EKG is ordered today.  The ekg ordered today demonstrates NSR 99 bpmw with prolonged QT 370/QTc 474, stable TWI leads V1-V6.  Recent Labs: 04/09/2019: ALT 18; BUN 13; Creatinine, Ser 0.84; Hemoglobin 11.5; Platelets 339; Potassium 4.2; Sodium 138; TSH 1.210  Recent Lipid Panel    Component Value Date/Time   CHOL 203 (H) 04/09/2019 1113   TRIG 81 04/09/2019 1113   HDL 56 04/09/2019 1113   CHOLHDL 3.6 04/09/2019 1113   LDLCALC 132 (H) 04/09/2019 1113    Home  Medications   Current Meds  Medication Sig  . escitalopram (LEXAPRO) 10 MG tablet Take 1 tablet (10 mg total) by mouth daily.  . Norethindrone-Ethinyl Estradiol-Fe Biphas (LO LOESTRIN FE) 1 MG-10 MCG / 10 MCG tablet Take 1 tablet by mouth daily.  . [DISCONTINUED] esomeprazole (NEXIUM) 20 MG capsule Take 20 mg by mouth daily at 12 noon.    Review of Systems     Review of Systems  Constitution: Negative for chills, fever and malaise/fatigue.  Cardiovascular: Positive for chest pain, dyspnea on exertion and palpitations. Negative for leg swelling, near-syncope,  orthopnea and syncope.  Respiratory: Positive for shortness of breath. Negative for cough and wheezing.   Gastrointestinal: Positive for heartburn. Negative for nausea and vomiting.  Neurological: Negative for dizziness, light-headedness and weakness.   All other systems reviewed and are otherwise negative except as noted above.  Physical Exam    VS:  BP 112/80 (BP Location: Left Arm, Patient Position: Sitting, Cuff Size: Normal)   Pulse 98   Ht 5' 3" (1.6 m)   Wt 200 lb 8 oz (90.9 kg)   SpO2 98%   BMI 35.52 kg/m  , BMI Body mass index is 35.52 kg/m. GEN: Well nourished, overweight, well developed, in no acute distress. HEENT: normal. Neck: Supple, no JVD, carotid bruits, or masses. Cardiac: RRR, no murmurs, rubs, or gallops. No clubbing, cyanosis, edema.  Radials/DP/PT 2+ and equal bilaterally.  Respiratory:  Respirations regular and unlabored, clear to auscultation bilaterally. GI: Soft, nontender, nondistended, BS + x 4. MS: No deformity or atrophy. Skin: Warm and dry, no rash. Neuro:  Strength and sensation are intact. Psych: Normal affect.  Assessment & Plan    1. Palpitations/Bradycardia/Prolonged QT - Prolonged QTc 474 on EKG today. EKG in ED with SB 51 bpm. Reports worsening palpitations over the last few months. Plan for 14 day ZIO monitor. Addendum 11/15/19: ZIO monitor showed predominantly NSR with average HR of 80bpm with rare (<1%) PVC/PAC and no significant arrhythmia.   2. Chest pain/Dyspnea/Family history of CAD - Recent chest pain in the setting of URI. Chest pain feels like "heaviness" and is different than previous indigestion symptoms. CXR 09/17/19 with no acute findings. Family history notable for premature CAD with her mother having her first MI at 36 years old. 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 Noes DOE which is likely multifactorial with deconditioning, obesity. Cannot rule out ischemia as contributory. Plan for cardiac CTA to rule out CAD.    3. GERD - Reports breakthrough symptoms on Nexium. 30 day trial of Protonix prescribed. Recommend avoid fried, greasy, acidic foods.   Disposition: Follow up in 2 month(s) with Dr. Gollan or APP   Caitlin S Walker, NP 09/26/2019, 3:23 PM 

## 2019-09-23 NOTE — Patient Instructions (Addendum)
Medication Instructions:  Your physician has recommended you make the following change in your medication:   STOP Nexium  START Protonix 79m daily  *If you need a refill on your cardiac medications before your next appointment, please call your pharmacy*  Lab Work: No lab work today.   You will need to have a Urine Pregnancy test prior to the Cardiac CT.  - [facility] You will check in at the front desk to the right as you walk into the atrium. Valet Parking is offered if needed. - No appointment needed. You may go any day between 7 am and 6 pm.   If you have labs (blood work) drawn today and your tests are completely normal, you will receive your results only by: .Marland KitchenMyChart Message (if you have MyChart) OR . A paper copy in the mail If you have any lab test that is abnormal or we need to change your treatment, we will call you to review the results.   Testing/Procedures: Your EKG today shows normal sinus rhythm with stable t-wave changes.  1- CARDIAC/CORONARY CTA - Your physician has requested that you have cardiac CT. Cardiac computed tomography (CT) is a painless test that uses an x-ray machine to take clear, detailed pictures of your heart. For further information please visit [email or web address] Please follow instruction sheet as given.  Your cardiac CT will be scheduled at one of the below locations:   [facility] [address]([phone] [place] [phone] [facility] please [facility] at the [facility] minutes prior to test start time. Pro[facility] to the [facility] (first floor) to check-in and test prep.  [facility] please arrive 15 mins early for check-in and test  prep.  Please follow these instructions carefully (unless otherwise directed):  Hold all erectile dysfunction medications at least 3 days (72 hrs) prior to test.  On the Night Before the Test: . Be sure to Drink plenty of water. . Do not consume any caffeinated/decaffeinated beverages or chocolate 12 hours prior to your test. . Do not take any antihistamines 12 hours prior to your test.   On the Day of the Test: . Drink plenty of water. Do not drink any water within one hour of the test. . Do not eat any food 4 hours prior to the test. . You may take your regular medications prior to the test.  . Take metoprolol (Lopressor) two hours prior to test. . HOLD Furosemide/Hydrochlorothiazide morning of the test. . FEMALES- please wear underwire-free bra if available   After the Test: . Drink plenty of water. . After receiving IV contrast, you may experience a mild flushed feeling. This is normal. . On occasion, you may experience a mild rash up to 24 hours after the test. This is not dangerous. If this occurs, you can take Benadryl 25 mg and increase your fluid intake. . If you experience trouble breathing, this can be serious. If it is severe call 911 IMMEDIATELY. If it is mild, please call our office.   Once we have confirmed authorization from your insurance company, we will call you to set up a date and time for your test.   For non-scheduling related questions, please contact the cardiac imaging nurse navigator should  you have any questions/concerns: Marchia Bond, Cardiac Imaging Nurse Navigator Burley Saver, Interim Cardiac Imaging Nurse Navigator [facility] Direct Office Dial: [phone]   For scheduling needs, including cancellations and rescheduling, please call [phone].     2-  ZIO MONITOR - Please wear the ZIO monitor that was placed today for 14 days.  Your physician has recommended that you wear a Zio monitor. This monitor is a medical  device that records the heart's electrical activity. Doctors most often use these monitors to diagnose arrhythmias. Arrhythmias are problems with the speed or rhythm of the heartbeat. The monitor is a small device applied to your chest. You can wear one while you do your normal daily activities. While wearing this monitor if you have any symptoms to push the button and record what you felt. Once you have worn this monitor for the period of time provider prescribed (Usually 14 days), you will return the monitor device in the postage paid box. Once it is returned they will download the data collected and provide [place] with a report which the provider will then review and we will call you with those results. Important tips:  1. Avoid showering during the first 24 hours of wearing the monitor. 2. Avoid excessive sweating to help maximize wear time. 3. Do not submerge the device, no hot tubs, and no swimming pools. 4. Keep any lotions or oils away from the patch. 5. After 24 hours you may shower with the patch on. Take brief showers with your back facing the shower head.  6. Do not remove patch once it has been placed because that will interrupt data and decrease adhesive wear time. 7. Push the button when you have any symptoms and write down what you were feeling. 8. Once you have completed wearing your monitor, remove and place into box which has postage paid and place in your outgoing mailbox.  9. If for some reason you have misplaced your box then call our office and we can provide another box and/or mail it off for you.  Follow-Up: At [facility], you and your health needs are our priority.  As part of our continuing mission to provide you with exceptional heart care, we have created designated Provider Care Teams.  These Care Teams include your primary Cardiologist (physician) and Advanced Practice Providers (APPs -  Physician Assistants and Nurse Practitioners) who all work together to provide you  with the care you need, when you need it.  We recommend signing up for the patient portal called "MyChart".  Sign up information is provided on this After Visit Summary.  MyChart is used to connect with patients for Virtual Visits (Telemedicine).  Patients are able to view lab/test results, encounter notes, upcoming appointments, etc.  Non-urgent messages can be sent to your provider as well.   To learn more about what you can do with MyChart, go to [email or web address].    Your next appointment:   2 month(s)  The format for your next appointment:   In Person  Provider:    You may see No primary care provider on file. or one of the following Advanced Practice Providers on your designated Care Team:    Murray Hodgkins, NP  Christell Faith, PA-C  Marrianne Mood, PA-C    Other Instructions  Your chest pain sounds like musculoskeletal pain. Recommend stretching, using heat, and ibuprofen or tylenol. Your acid reflux could also be contributory. We have changed your acid reflux medication to try to  help. Avoid spicy, fried, and acidic foods.   Recommend increasing your fluid intake. This will help prevent your lightheadedness, dehydration, headaches, palpitations.   Recommend compression stockings.    Palpitations Palpitations are feelings that your heartbeat is not normal. Your heartbeat may feel like it is:  Uneven.  Faster than normal.  Fluttering.  Skipping a beat. This is usually not a serious problem. In some cases, you may need tests to rule out any serious problems. Follow these instructions at home: Pay attention to any changes in your condition. Take these actions to help manage your symptoms: Eating and drinking  Avoid: ? Coffee, tea, soft drinks, and energy drinks. ? Chocolate. ? Alcohol. ? Diet pills. Lifestyle   Try to lower your stress. These things can help you relax: ? Yoga. ? Deep breathing and meditation. ? Exercise. ? Using words and images to  create positive thoughts (guided imagery). ? Using your mind to control things in your body (biofeedback).  Do not use drugs.  Get plenty of rest and sleep. Keep a regular bed time. General instructions   Take over-the-counter and prescription medicines only as told by your doctor.  Do not use any products that contain nicotine or tobacco, such as cigarettes and e-cigarettes. If you need help quitting, ask your doctor.  Keep all follow-up visits as told by your doctor. This is important. You may need more tests if palpitations do not go away or get worse. Contact a doctor if:  Your symptoms last more than 24 hours.  Your symptoms occur more often. Get help right away if you:  Have chest pain.  Feel short of breath.  Have a very bad headache.  Feel dizzy.  Pass out (faint). Summary  Palpitations are feelings that your heartbeat is uneven or faster than normal. It may feel like your heart is fluttering or skipping a beat.  Avoid food and drinks that may cause palpitations. These include caffeine, chocolate, and alcohol.  Try to lower your stress. Do not smoke or use drugs.  Get help right away if you faint or have chest pain, shortness of breath, a severe headache, or dizziness. This information is not intended to replace advice given to you by your health care provider. Make sure you discuss any questions you have with your health care provider. Document Revised: 05/28/2017 Document Reviewed: 05/28/2017 Elsevier Patient Education  2020 [employer].

## 2019-09-26 ENCOUNTER — Encounter: Payer: Self-pay | Admitting: Family

## 2019-10-06 ENCOUNTER — Ambulatory Visit: Payer: Self-pay | Admitting: *Deleted

## 2019-10-06 DIAGNOSIS — F320 Major depressive disorder, single episode, mild: Secondary | ICD-10-CM

## 2019-10-08 NOTE — Patient Instructions (Addendum)
Thank you allowing the Chronic Care Management Team to be a part of your care! It was a pleasure speaking with you today!  Ms. Westcott was given information about  Care Management services today including:  1. CM service includes personalized support from designated clinical staff supervised by her physician, including individualized plan of care and coordination with other care providers 2. 24/7 contact phone numbers for assistance for urgent and routine care needs. 3. The patient may stop CM services at any time (effective at the end of the month) by phone call to the office staff.    CCM (Chronic Care Management) Team   Juanell Fairly RN, BSN Nurse Care Coordinator  [phone]  [place], LCSW Clinical Social Worker [phone]  Goals Addressed              This Visit's Progress   .  "I would like to see a therapist to help with my depression" (pt-stated)        CARE PLAN ENTRY (see longitudinal plan of care for additional care plan information)  Current Barriers:  Marland Kitchen Mental Health Concerns   Clinical Social Work Clinical Goal(s):  Marland Kitchen Over the next 90 days, patient will follow up with the mental health agency discussed for ongoing mental health therapy* as directed by SW  Interventions: . Inter-disciplinary care team collaboration (see longitudinal plan of care) . Patient interviewed and appropriate assessments performed . Provided mental health counseling with regard to depression (mental health diagnosis or concern) . Provided patient with information about plan to provide patient with list of  in-network mental health providers  . Discussed plans with patient for ongoing care management follow up and provided patient with direct contact information for care management team . [facility] agency) re: in network status . Referred patient to the [facility] (mental health provider)  for long term follow up and therapy/counseling  Patient Self Care Activities:  . Performs ADL's independently . Performs IADL's independently . Knowledge deficit regarding local mental health agencies  Initial goal documentation         The patient verbalized understanding of instructions provided today and declined a print copy of patient instruction materials.   Telephone follow up appointment with care management team member scheduled for: 10/12/19

## 2019-10-08 NOTE — Chronic Care Management (AMB) (Signed)
   Care Management    Clinical Social Work General Note  10/08/2019 Name: Desiree Collins MRN: [id number] DOB: Feb 23, 1986  Desiree Collins is a 33 y.o. year old female who is a primary care patient of Trey Sailors, [place]. The CCM was consulted to assist the patient with Mental Health Counseling and Resources.   Desiree Collins was given information about  Care Management services today including:  1. CM service includes personalized support from designated clinical staff supervised by her physician, including individualized plan of care and coordination with other care providers 2. 24/7 contact phone numbers for assistance for urgent and routine care needs. 3. The patient may stop CM services at any time (effective at the end of the month) by phone call to the office staff.  Patient agreed to services and verbal consent obtained.   Review of patient status, including review of consultants reports, relevant laboratory and other test results, and collaboration with appropriate care team members and the patient's provider was performed as part of comprehensive patient evaluation and provision of chronic care management services.    SDOH (Social Determinants of Health) assessments and interventions performed:  Yes    Outpatient Encounter Medications as of 10/06/2019  Medication Sig  . escitalopram (LEXAPRO) 10 MG tablet Take 1 tablet (10 mg total) by mouth daily.  . pantoprazole (PROTONIX) 40 MG tablet Take 1 tablet (40 mg total) by mouth daily.   No facility-administered encounter medications on file as of 10/06/2019.    Goals Addressed              This Visit's Progress   .  "I wojuld like to see a therapist to help with my depression" (pt-stated)        CARE PLAN ENTRY (see longitudinal plan of care for additional care plan information)  Current Barriers:  Marland Kitchen Mental Health Concerns   Clinical Social Work Clinical Goal(s):  Marland Kitchen Over the next 90 days, patient will follow up with the  mental health agency discussed for ongoing mental health therapy* as directed by SW  Interventions: . Inter-disciplinary care team collaboration (see longitudinal plan of care) . Patient interviewed and appropriate assessments performed . Provided mental health counseling with regard to depression (mental health diagnosis or concern) . Provided patient with information about plan to provide patient with list of  in-network mental health providers  . Discussed plans with patient for ongoing care management follow up and provided patient with direct contact information for care management team . [facility] with [facility] ([facility] agency) re: in network status . Referred patient to the [facility] (mental health provider) for long term follow up and therapy/counseling  Patient Self Care Activities:  . Performs ADL's independently . Performs IADL's independently . Knowledge deficit regarding local mental health agencies  Initial goal documentation         Follow Up Plan: SW will follow up with patient by phone over the next 7-10 days       Kathya Wilz, [place] Clinical Social Worker  [facility] Care Management [phone]

## 2019-10-12 ENCOUNTER — Telehealth: Payer: No Typology Code available for payment source

## 2019-10-15 ENCOUNTER — Telehealth: Payer: No Typology Code available for payment source

## 2019-10-15 ENCOUNTER — Ambulatory Visit: Payer: Self-pay | Admitting: *Deleted

## 2019-10-15 NOTE — Chronic Care Management (AMB) (Signed)
   Care Management   Unsuccessful Call Note 10/15/2019 Name: Desiree Collins MRN: [id number] DOB: 05-04-1985  Patient  is a 34 year old female who sees Osvaldo Angst PA-C  for primary care. Osvaldo Angst, PA-C asked the CCM team to consult the patient for Mental Health Counseling and Resources.    This social worker was unable to reach patient via telephone today for follow up call regarding referral for ongoing mental health therapy.. I have left HIPAA compliant voicemail asking patient to return my call. (unsuccessful outreach #1).   Plan: Will follow-up within 7 business days via telephone.      Verna Czech, LCSW Clinical Social Worker  [facility] Care Management [phone]

## 2019-10-22 ENCOUNTER — Ambulatory Visit: Payer: Self-pay | Admitting: *Deleted

## 2019-10-22 ENCOUNTER — Telehealth: Payer: No Typology Code available for payment source | Admitting: *Deleted

## 2019-10-22 NOTE — Chronic Care Management (AMB) (Signed)
    Care Management   Unsuccessful Call Note 10/22/2019 Name: Desiree Collins MRN: [id number] DOB: 1986-01-19  Patient is a 44 ear old female who sees Osvaldo Angst, [place] for primary care. Osvaldo Angst, PA-C asked the CCM team to consult the patient for Mental Health Counseling and Resources.    This Child psychotherapist as unable to reach patient via telephone today for follow up call. I have left HIPAA compliant voicemail asking patient to return my call. (unsuccessful outreach #2).   Plan: Will follow-up within 7 business days via telephone.      Verna Czech, LCSW Clinical Social Worker  [facility] Care Management [phone]

## 2019-10-23 ENCOUNTER — Other Ambulatory Visit: Payer: Self-pay

## 2019-10-23 DIAGNOSIS — Z5321 Procedure and treatment not carried out due to patient leaving prior to being seen by health care provider: Secondary | ICD-10-CM | POA: Diagnosis not present

## 2019-10-23 DIAGNOSIS — R109 Unspecified abdominal pain: Secondary | ICD-10-CM | POA: Diagnosis not present

## 2019-10-23 LAB — COMPREHENSIVE METABOLIC PANEL
ALT: 18 U/L (ref 0–44)
AST: 26 U/L (ref 15–41)
Albumin: 3.7 g/dL (ref 3.5–5.0)
Alkaline Phosphatase: 65 U/L (ref 38–126)
Anion gap: 8 (ref 5–15)
BUN: 18 mg/dL (ref 6–20)
CO2: 22 mmol/L (ref 22–32)
Calcium: 8.8 mg/dL — ABNORMAL LOW (ref 8.9–10.3)
Chloride: 107 mmol/L (ref 98–111)
Creatinine, Ser: 0.96 mg/dL (ref 0.44–1.00)
GFR calc Af Amer: >60 mL/min (ref >60)
GFR calc non Af Amer: >60 mL/min (ref >60)
Glucose, Bld: 134 mg/dL — ABNORMAL HIGH (ref 70–99)
Potassium: 3.4 mmol/L — ABNORMAL LOW (ref 3.5–5.1)
Sodium: 137 mmol/L (ref 135–145)
Total Bilirubin: 0.5 mg/dL (ref 0.3–1.2)
Total Protein: 7.3 g/dL (ref 6.5–8.1)

## 2019-10-23 LAB — CBC
HCT: 32.8 % — ABNORMAL LOW (ref 36.0–46.0)
Hemoglobin: 11.1 g/dL — ABNORMAL LOW (ref 12.0–15.0)
MCH: 27.5 pg (ref 26.0–34.0)
MCHC: 33.8 g/dL (ref 30.0–36.0)
MCV: 81.2 fL (ref 80.0–100.0)
Platelets: 426 10*3/uL — ABNORMAL HIGH (ref 150–400)
RBC: 4.04 MIL/uL (ref 3.87–5.11)
RDW: 15.9 % — ABNORMAL HIGH (ref 11.5–15.5)
WBC: 7.1 10*3/uL (ref 4.0–10.5)
nRBC: 0 % (ref 0.0–0.2)

## 2019-10-23 NOTE — ED Triage Notes (Addendum)
Pt with right lower quadrant pain that radiates to right lower flank with emesis, diarrhea. Pt states pain has been present for a week, but vomiting started tonight, diarrhea 2-3 days pta.

## 2019-10-24 ENCOUNTER — Emergency Department
Admission: EM | Admit: 2019-10-24 | Discharge: 2019-10-24 | Disposition: A | Discharge status: Home / Self Care | Payer: No Typology Code available for payment source | Attending: Emergency Medicine | Admitting: Emergency Medicine

## 2019-10-25 ENCOUNTER — Encounter: Payer: Self-pay | Admitting: Emergency Medicine

## 2019-10-25 ENCOUNTER — Emergency Department: Payer: No Typology Code available for payment source

## 2019-10-25 ENCOUNTER — Other Ambulatory Visit: Payer: Self-pay

## 2019-10-25 DIAGNOSIS — R1033 Periumbilical pain: Secondary | ICD-10-CM | POA: Insufficient documentation

## 2019-10-25 DIAGNOSIS — R197 Diarrhea, unspecified: Secondary | ICD-10-CM | POA: Diagnosis not present

## 2019-10-25 DIAGNOSIS — J45909 Unspecified asthma, uncomplicated: Secondary | ICD-10-CM | POA: Insufficient documentation

## 2019-10-25 DIAGNOSIS — Z79899 Other long term (current) drug therapy: Secondary | ICD-10-CM | POA: Diagnosis not present

## 2019-10-25 DIAGNOSIS — R112 Nausea with vomiting, unspecified: Secondary | ICD-10-CM | POA: Diagnosis not present

## 2019-10-25 DIAGNOSIS — Z20822 Contact with and (suspected) exposure to covid-19: Secondary | ICD-10-CM | POA: Insufficient documentation

## 2019-10-25 LAB — URINALYSIS, COMPLETE (UACMP) WITH MICROSCOPIC
Bacteria, UA: NONE SEEN
Bilirubin Urine: NEGATIVE
Glucose, UA: NEGATIVE mg/dL
Hgb urine dipstick: NEGATIVE
Ketones, ur: NEGATIVE mg/dL
Leukocytes,Ua: NEGATIVE
Nitrite: NEGATIVE
Protein, ur: NEGATIVE mg/dL
Specific Gravity, Urine: 1.008 (ref 1.005–1.030)
pH: 5 (ref 5.0–8.0)

## 2019-10-25 LAB — COMPREHENSIVE METABOLIC PANEL
ALT: 18 U/L (ref 0–44)
AST: 20 U/L (ref 15–41)
Albumin: 3.9 g/dL (ref 3.5–5.0)
Alkaline Phosphatase: 52 U/L (ref 38–126)
Anion gap: 9 (ref 5–15)
BUN: 12 mg/dL (ref 6–20)
CO2: 24 mmol/L (ref 22–32)
Calcium: 8.4 mg/dL — ABNORMAL LOW (ref 8.9–10.3)
Chloride: 106 mmol/L (ref 98–111)
Creatinine, Ser: 0.79 mg/dL (ref 0.44–1.00)
GFR calc Af Amer: >60 mL/min (ref >60)
GFR calc non Af Amer: >60 mL/min (ref >60)
Glucose, Bld: 100 mg/dL — ABNORMAL HIGH (ref 70–99)
Potassium: 3.6 mmol/L (ref 3.5–5.1)
Sodium: 139 mmol/L (ref 135–145)
Total Bilirubin: 0.5 mg/dL (ref 0.3–1.2)
Total Protein: 7.3 g/dL (ref 6.5–8.1)

## 2019-10-25 LAB — CBC
HCT: 32.9 % — ABNORMAL LOW (ref 36.0–46.0)
Hemoglobin: 10.8 g/dL — ABNORMAL LOW (ref 12.0–15.0)
MCH: 27.1 pg (ref 26.0–34.0)
MCHC: 32.8 g/dL (ref 30.0–36.0)
MCV: 82.7 fL (ref 80.0–100.0)
Platelets: 454 10*3/uL — ABNORMAL HIGH (ref 150–400)
RBC: 3.98 MIL/uL (ref 3.87–5.11)
RDW: 15.6 % — ABNORMAL HIGH (ref 11.5–15.5)
WBC: 8 10*3/uL (ref 4.0–10.5)
nRBC: 0 % (ref 0.0–0.2)

## 2019-10-25 LAB — POCT PREGNANCY, URINE: Preg Test, Ur: NEGATIVE

## 2019-10-25 LAB — LIPASE, BLOOD: Lipase: 24 U/L (ref 11–51)

## 2019-10-25 MED ORDER — IOHEXOL 300 MG/ML  SOLN
100.0000 mL | Freq: Once | INTRAMUSCULAR | Status: AC | PRN
  Administered 2019-10-25: 100 mL via INTRAVENOUS

## 2019-10-25 NOTE — ED Triage Notes (Signed)
Pt left due to wait time on 6/26, contacted PCP for appointment and was sent back to ED. Pt c/o right lower quadrant pain. Pt denies urinary symptoms. Pt hs had intermittent N/V.

## 2019-10-26 ENCOUNTER — Emergency Department
Admission: EM | Admit: 2019-10-26 | Discharge: 2019-10-26 | Disposition: A | Discharge status: Home / Self Care | Payer: No Typology Code available for payment source | Attending: Emergency Medicine | Admitting: Emergency Medicine

## 2019-10-26 DIAGNOSIS — R1033 Periumbilical pain: Secondary | ICD-10-CM

## 2019-10-26 LAB — SARS CORONAVIRUS 2 BY RT PCR (HOSPITAL ORDER, PERFORMED IN ~~LOC~~ HOSPITAL LAB): SARS Coronavirus 2: NEGATIVE

## 2019-10-26 MED ORDER — SUCRALFATE 1 G PO TABS
1.0000 g | ORAL_TABLET | Freq: Three times a day (TID) | ORAL | 0 refills | Status: AC
Start: 2019-10-26 — End: 2019-12-02

## 2019-10-26 MED ORDER — DICYCLOMINE HCL 10 MG PO CAPS: 10.0000 mg | ORAL_CAPSULE | Freq: Four times a day (QID) | ORAL | 0 refills | Status: AC

## 2019-10-26 MED ORDER — PANTOPRAZOLE SODIUM 20 MG PO TBEC
40.0000 mg | DELAYED_RELEASE_TABLET | Freq: Every day | ORAL | 0 refills | Status: DC
Start: 2019-10-26 — End: 2019-11-24

## 2019-10-26 MED ORDER — ONDANSETRON 4 MG PO TBDP
4.0000 mg | ORAL_TABLET | Freq: Three times a day (TID) | ORAL | 0 refills | Status: AC | PRN
Start: 2019-10-26 — End: ?

## 2019-10-26 NOTE — Discharge Instructions (Addendum)
Take the protonix/carafate to help with acid in stomach. Take the zofran to help with nausea Take tylenol 1g every 8 hours to help with pain Take bentyl to help with abdominal pain/spasming.   Return to ER for worsening pain, not eating, fevers or any other concerns.  Follow up with GI if symptoms are not resolving.

## 2019-10-26 NOTE — ED Provider Notes (Signed)
[facility] Emergency Department Provider Note  ____________________________________________   None    (approximate)  I have reviewed the triage vital signs and the nursing notes.   HISTORY  Chief Complaint Abdominal Pain    HPI Desiree Collins is a 34 y.o. female with asthma who comes in for abdominal pain. Pt has had pain around her umbilical region that occasionally radiates around her back for the past 7 days. It has been associated with N/V/D. Denies any bleeding. The pain is moderate, constant, nothing makes it better(not taking anything) Nothing makes it worse. Denies NSAID, smoking alcohol. No lower abd pain and no vaginal discharge.  Does have vaginal cup in place for menstruations. Denies prior abdominal surgery. LWBS last time in ED and never had imaging done.  PCP told to come back to ED. No respiratory symptoms.           Past Medical History:  Diagnosis Date  . Asthma   . BRCA negative 05/2019   MyRisk neg except AXIN2, BMPR1A, CHEK2, MSH3, POLE VUS  . Family history of breast cancer   . Family history of ovarian cancer   . Increased risk of breast cancer 05/2019   IBIS=23.7%    Patient Active Problem List   Diagnosis Date Noted  . Asthma 09/22/2019  . Paroxysmal tachycardia ([place]) 09/22/2019  . Palpitations 04/12/2019  . Chronic bilateral thoracic back pain 03/13/2015  . Anemia 11/10/2012  . Vitamin D deficiency 11/10/2012    Past Surgical History:  Procedure Laterality Date  . LEEP    . TUBAL LIGATION      Prior to Admission medications   Medication Sig Start Date End Date Taking? Authorizing Provider  escitalopram (LEXAPRO) 10 MG tablet Take 1 tablet (10 mg total) by mouth daily. 09/22/19   Trinna Post, PA-C  metoprolol tartrate (LOPRESSOR) 100 MG tablet Take 1 tablet (100 mg total) by mouth once for 1 dose. Take 2 hours prior to CT. 09/23/19 09/23/19  Loel Dubonnet, NP  Norethindrone-Ethinyl Estradiol-Fe Biphas  (LO LOESTRIN FE) 1 MG-10 MCG / 10 MCG tablet Take 1 tablet by mouth daily. 06/18/19 09/23/19  Schuman, Stefanie Libel, MD  pantoprazole (PROTONIX) 40 MG tablet Take 1 tablet (40 mg total) by mouth daily. 09/23/19   Loel Dubonnet, NP    Allergies Chocolate and Morphine  Family History  Problem Relation Age of Onset  . Diabetes Mother   . Breast cancer Mother 14       again at 46  . Ovarian cancer Mother 25  . Hypertension Father   . Breast cancer Maternal Grandmother 32       again at 43  . Cervical cancer Maternal Grandmother   . Bone cancer Maternal Grandfather   . Colon cancer Maternal Grandfather 48  . Breast cancer Maternal Aunt 52  . Breast cancer Paternal Grandmother   . Breast cancer Cousin   . Breast cancer Cousin     Social History Social History   Tobacco Use  . Smoking status: Never Smoker  . Smokeless tobacco: Never Used  Vaping Use  . Vaping Use: Never used  Substance Use Topics  . Alcohol use: Yes  . Drug use: Never      Review of Systems Constitutional: No fever/chills Eyes: No visual changes. ENT: No sore throat. Cardiovascular: Denies chest pain. Respiratory: Denies shortness of breath. Gastrointestinal: Positive abdominal pain, n, v, d Genitourinary: Negative for dysuria. Musculoskeletal: Negative for back pain. Skin: Negative for rash.  Neurological: Negative for headaches, focal weakness or numbness. All other ROS negative ____________________________________________   PHYSICAL EXAM:  VITAL SIGNS: ED Triage Vitals [10/25/19 2035]  Enc Vitals Group     BP (!) 158/92     Pulse Rate 75     Resp 17     Temp 99.3 F (37.4 C)     Temp Source Oral     SpO2 100 %     Weight      Height      Head Circumference      Peak Flow      Pain Score      Pain Loc      Pain Edu?      Excl. in [place]?     Constitutional: Alert and oriented. Well appearing and in no acute distress. Eyes: Conjunctivae are normal. EOMI. Head: Atraumatic. Nose: No  congestion/rhinnorhea. Mouth/Throat: Mucous membranes are moist.   Neck: No stridor. Trachea Midline. FROM Cardiovascular: Normal rate, regular rhythm. Grossly normal heart sounds.  Good peripheral circulation. Respiratory: Normal respiratory effort.  No retractions. Lungs CTAB. Gastrointestinal: Soft with some reported tenderness around umbilicus but no lower abd pain no rebound no gaurding . No distention. No abdominal bruits.  Musculoskeletal: No lower extremity tenderness nor edema.  No joint effusions. Neurologic:  Normal speech and language. No gross focal neurologic deficits are appreciated.  Skin:  Skin is warm, dry and intact. No rash noted. Psychiatric: Mood and affect are normal. Speech and behavior are normal. GU: Deferred   ____________________________________________   LABS (all labs ordered are listed, but only abnormal results are displayed)  Labs Reviewed  COMPREHENSIVE METABOLIC PANEL - Abnormal; Notable for the following components:      Result Value   Glucose, Bld 100 (*)    Calcium 8.4 (*)    All other components within normal limits  CBC - Abnormal; Notable for the following components:   Hemoglobin 10.8 (*)    HCT 32.9 (*)    RDW 15.6 (*)    Platelets 454 (*)    All other components within normal limits  URINALYSIS, COMPLETE (UACMP) WITH MICROSCOPIC - Abnormal; Notable for the following components:   Color, Urine STRAW (*)    APPearance CLEAR (*)    All other components within normal limits  LIPASE, BLOOD  POC URINE PREG, ED  POCT PREGNANCY, URINE   ____________________________________________   RADIOLOGY   Official radiology report(s): CT ABDOMEN PELVIS W CONTRAST  Result Date: 10/25/2019 CLINICAL DATA:  Abdominal distension, right lower quadrant pain EXAM: CT ABDOMEN AND PELVIS WITH CONTRAST TECHNIQUE: Multidetector CT imaging of the abdomen and pelvis was performed using the standard protocol following bolus administration of intravenous  contrast. CONTRAST:  180m OMNIPAQUE IOHEXOL 300 MG/ML  SOLN COMPARISON:  None. FINDINGS: Lower chest: Lung bases are clear. No effusions. Heart is normal size. Hepatobiliary: No focal hepatic abnormality. Gallbladder contracted. Pancreas: No focal abnormality or ductal dilatation. Spleen: No focal abnormality.  Normal size. Adrenals/Urinary Tract: No adrenal abnormality. No focal renal abnormality. No stones or hydronephrosis. Urinary bladder is unremarkable. Stomach/Bowel: Normal appendix. Stomach, large and small bowel grossly unremarkable. Vascular/Lymphatic: No evidence of aneurysm or adenopathy. Reproductive: Uterus and adnexa unremarkable. No mass. Probable diaphragm within the vagina. Other: No free fluid or free air. Musculoskeletal: No acute bony abnormality. IMPRESSION: Normal appendix. No acute findings in the abdomen or pelvis. Electronically Signed   By: KRolm BaptiseM.D.   On: 10/25/2019 23:52    ____________________________________________   PROCEDURES  Procedure(s) performed (including Critical Care):  Procedures   ____________________________________________   INITIAL IMPRESSION / ASSESSMENT AND PLAN / ED COURSE  Desiree Collins was evaluated in Emergency Department on 10/26/2019 for the symptoms described in the history of present illness. She was evaluated in the context of the global COVID-19 pandemic, which necessitated consideration that the patient might be at risk for infection with the SARS-CoV-2 virus that causes COVID-19. Institutional protocols and algorithms that pertain to the evaluation of patients at risk for COVID-19 are in a state of rapid change based on information released by regulatory bodies including the CDC and federal and state organizations. These policies and algorithms were followed during the patient's care in the ED.    Pt with pain around her umbilicus. Slightly HTN but otherwise well appearing at this time. No rebound or gaurding. CT order to  evaluate for appendicitis given continued pain and report of some pain initially in RLQ but does not seem to be tender in the lower abdomen at this time.  Will also evaluate for kidney stones, gallbladder. Less likely ovarian pathology given no lower abd pain. Denies symptoms of STD/new partners/no lower abd pain and declined pelvic exam. Low suspicion for STDS.    Pregnancy test is negative.  Labs are reassuring.  Hemoglobin is 10.8 which is around patient's baseline.  UA without evidence of UTI.  CT scan does not show evidence of appendicitis and the ovaries appear normal.  Concern for possible diaphragm within the vagina. Pt does report vaginal cup.   CT negative. D/w pt possible viral illness, will send covid. No recent antibiotics to suggest c. Diff.  Consider irritable bowel vs gastritis. Does report eating a lot of spicy foods. Discussed symptom management with tylenol, PPI, carafate, zofran, and bentyl.  Given GI for follow up if not resolving. Pt felt comfortable with this plan.   I discussed the provisional nature of ED diagnosis, the treatment so far, the ongoing plan of care, follow up appointments and return precautions with the patient and any family or support people present. They expressed understanding and agreed with the plan, discharged home. ____________________________________________   FINAL CLINICAL IMPRESSION(S) / ED DIAGNOSES   Final diagnoses:  Periumbilical abdominal pain      MEDICATIONS GIVEN DURING THIS VISIT:  Medications  iohexol (OMNIPAQUE) 300 MG/ML solution 100 mL (100 mLs Intravenous Contrast Given 10/25/19 2342)     ED Discharge Orders         Ordered    dicyclomine (BENTYL) 10 MG capsule  4 times daily     Discontinue  Reprint     10/26/19 0050    ondansetron (ZOFRAN ODT) 4 MG disintegrating tablet  Every 8 hours PRN     Discontinue  Reprint     10/26/19 0050    sucralfate (CARAFATE) 1 g tablet  3 times daily with meals & bedtime     Discontinue   Reprint     10/26/19 0050    pantoprazole (PROTONIX) 20 MG tablet  Daily     Discontinue  Reprint     10/26/19 0050           Note:  This document was prepared using Dragon voice recognition software and may include unintentional dictation errors.   Vanessa Franklin, MD 10/26/19 [phone]

## 2019-10-27 ENCOUNTER — Ambulatory Visit: Payer: No Typology Code available for payment source | Admitting: Physician Assistant

## 2019-10-29 ENCOUNTER — Ambulatory Visit: Payer: No Typology Code available for payment source | Admitting: *Deleted

## 2019-10-29 DIAGNOSIS — F320 Major depressive disorder, single episode, mild: Secondary | ICD-10-CM

## 2019-10-29 DIAGNOSIS — F32A Depression, unspecified: Secondary | ICD-10-CM

## 2019-10-29 NOTE — Patient Instructions (Signed)
Thank you allowing the Chronic Care Management Team to be a part of your care! It was a pleasure speaking with you today!  1. Please continue to follow up with mental health counseling  CCM (Chronic Care Management) Team   Juanell Fairly RN, BSN Nurse Care Coordinator  [phone]   Trenesha Alcaide [address], LCSW Clinical Social Worker [phone]  Goals Addressed              This Visit's Progress   .  "I would like to see a therapist to help with my depression completed (pt-stated)        CARE PLAN ENTRY (see longitudinal plan of care for additional care plan information)  Current Barriers:  Marland Kitchen Mental Health Concerns   Clinical Social Work Clinical Goal(s):  Marland Kitchen Over the next 90 days, patient will follow up with the mental health agency discussed for ongoing mental health therapy as directed by SW  Interventions: . Confirmed that  patient has started counseling with  [facility] (mental health provider) for long term follow up and therapy/counseling as of last week . Confirmed that she will continue to go recognizing that the process may take some adjusting  . Discussed plans with patient for ongoing care management follow up and provided patient with direct contact information for care management team in the future if needed  Patient Self Care Activities:  . Performs ADL's independently . Performs IADL's independently . Knowledge deficit regarding local mental health agencies  Please see past updates related to this goal by clicking on the "Past Updates" button in the selected goal          The patient verbalized understanding of instructions provided today and declined a print copy of patient instruction materials.   No further follow up required: patient to continue with ongoing mental health counseling with [facility]

## 2019-10-29 NOTE — Chronic Care Management (AMB) (Signed)
  Chronic Care Management    Clinical Social Work Follow Up Note  10/29/2019 Name: Desiree Collins MRN: [id number] DOB: 1985-06-12  Desiree Collins is a 34 y.o. year old female who is a primary care patient of Trey Sailors, [place]. The CCM team was consulted for assistance with Mental Health Counseling and Resources.   Review of patient status, including review of consultants reports, other relevant assessments, and collaboration with appropriate care team members and the patient's provider was performed as part of comprehensive patient evaluation and provision of chronic care management services.    SDOH (Social Determinants of Health) assessments performed: No    Outpatient Encounter Medications as of 10/29/2019  Medication Sig  . dicyclomine (BENTYL) 10 MG capsule Take 1 capsule (10 mg total) by mouth 4 (four) times daily for 14 days.  Marland Kitchen escitalopram (LEXAPRO) 10 MG tablet Take 1 tablet (10 mg total) by mouth daily.  . metoprolol tartrate (LOPRESSOR) 100 MG tablet Take 1 tablet (100 mg total) by mouth once for 1 dose. Take 2 hours prior to CT.  Marland Kitchen Norethindrone-Ethinyl Estradiol-Fe Biphas (LO LOESTRIN FE) 1 MG-10 MCG / 10 MCG tablet Take 1 tablet by mouth daily.  . ondansetron (ZOFRAN ODT) 4 MG disintegrating tablet Take 1 tablet (4 mg total) by mouth every 8 (eight) hours as needed for nausea or vomiting.  . pantoprazole (PROTONIX) 20 MG tablet Take 2 tablets (40 mg total) by mouth daily.  . sucralfate (CARAFATE) 1 g tablet Take 1 tablet (1 g total) by mouth 4 (four) times daily -  with meals and at bedtime.   No facility-administered encounter medications on file as of 10/29/2019.     Goals Addressed              This Visit's Progress   .  "I would like to see a therapist to help with my depression completed (pt-stated)        CARE PLAN ENTRY (see longitudinal plan of care for additional care plan information)  Current Barriers:  Marland Kitchen Mental Health Concerns   Clinical Social Work  Clinical Goal(s):  Marland Kitchen Over the next 90 days, patient will follow up with the mental health agency discussed for ongoing mental health therapy as directed by SW  Interventions: . Confirmed that  patient has started counseling with  [facility] (mental health provider) for long term follow up and therapy/counseling as of last week . Confirmed that she will continue to go recognizing that the process may take some adjusting  . Discussed plans with patient for ongoing care management follow up and provided patient with direct contact information for care management team in the future if needed  Patient Self Care Activities:  . Performs ADL's independently . Performs IADL's independently . Knowledge deficit regarding local mental health agencies  Please see past updates related to this goal by clicking on the "Past Updates" button in the selected goal          Follow Up Plan: Client will follow up with [facility] for ongoing counseling    Aliene Tamura, LCSW Clinical Social Worker  [facility] Care Management [phone]

## 2019-11-04 ENCOUNTER — Ambulatory Visit (INDEPENDENT_AMBULATORY_CARE_PROVIDER_SITE_OTHER): Payer: No Typology Code available for payment source | Admitting: Physician Assistant

## 2019-11-04 ENCOUNTER — Other Ambulatory Visit: Payer: Self-pay

## 2019-11-04 ENCOUNTER — Encounter: Payer: Self-pay | Admitting: Physician Assistant

## 2019-11-04 VITALS — BP 122/88 | HR 70 | Temp 96.2°F | Wt 200.4 lb

## 2019-11-04 DIAGNOSIS — F329 Major depressive disorder, single episode, unspecified: Secondary | ICD-10-CM | POA: Diagnosis not present

## 2019-11-04 DIAGNOSIS — F419 Anxiety disorder, unspecified: Secondary | ICD-10-CM | POA: Diagnosis not present

## 2019-11-04 DIAGNOSIS — Z23 Encounter for immunization: Secondary | ICD-10-CM | POA: Diagnosis not present

## 2019-11-04 DIAGNOSIS — F32A Depression, unspecified: Secondary | ICD-10-CM

## 2019-11-04 MED ORDER — BUSPIRONE HCL 7.5 MG PO TABS: 7.5000 mg | ORAL_TABLET | Freq: Two times a day (BID) | ORAL | 1 refills | Status: DC

## 2019-11-04 NOTE — Progress Notes (Signed)
Established patient visit   Patient: Desiree Collins   DOB: 03-18-1986   34 y.o. Female  MRN: [id number] Visit Date: 11/04/2019  Today's healthcare provider: Trey Sailors, PA-C   Chief Complaint  Patient presents with  . Depression  . Anxiety  I,Curstin Schmale M Ma Munoz,acting as a scribe for Trey Sailors, PA-C.,have documented all relevant documentation on the behalf of Trey Sailors, PA-C,as directed by  Trey Sailors, PA-C while in the presence of Trey Sailors, PA-C.  Subjective    HPI  Anxiety, Follow-up  She was last seen for anxiety 6 weeks ago. Changes made at last visit include starting on Lexapro 10mg  daily. Patient was also referred to CCM to discuss anxiety/depression issues.    She reports fair compliance with treatment. Patient reports she stopped taking the medication 2 weeks ago. Historically has not tolerated celexa for similar reasons.  She reports good to fair tolerance of treatment. She is having side effects. Dizzy,light-headedness and nausea  She feels her anxiety is mild and Unchanged since last visit.  Symptoms: No chest pain Yes difficulty concentrating  No dizziness No fatigue  Yes feelings of losing control No insomnia  Yes irritable No palpitations  No panic attacks No racing thoughts  No shortness of breath No sweating  No tremors/shakes    GAD-7 Results GAD-7 Generalized Anxiety Disorder Screening Tool 11/04/2019 09/22/2019  1. Feeling Nervous, Anxious, or on Edge 3 3  2. Not Being Able to Stop or Control Worrying 3 2  3. Worrying Too Much About Different Things 3 2  4. Trouble Relaxing 3 3  5. Being So Restless it's Hard To Sit Still 3 1  6. Becoming Easily Annoyed or Irritable 3 3  7. Feeling Afraid As If Something Awful Might Happen 0 2  Total GAD-7 Score 18 16  Difficulty At Work, Home, or Getting  Along With Others? Very difficult Very difficult    PHQ-9 Scores PHQ9 SCORE ONLY 11/04/2019 10/06/2019 09/22/2019  PHQ-9 Total  Score 18 15 17    Patient reports she see a counselor name 09/24/2019 from [facility]. She has seen [place] 2 times in person.     Medications: Outpatient Medications Prior to Visit  Medication Sig  . dicyclomine (BENTYL) 10 MG capsule Take 1 capsule (10 mg total) by mouth 4 (four) times daily for 14 days.  . ondansetron (ZOFRAN ODT) 4 MG disintegrating tablet Take 1 tablet (4 mg total) by mouth every 8 (eight) hours as needed for nausea or vomiting.  . pantoprazole (PROTONIX) 20 MG tablet Take 2 tablets (40 mg total) by mouth daily.  . sucralfate (CARAFATE) 1 g tablet Take 1 tablet (1 g total) by mouth 4 (four) times daily -  with meals and at bedtime.  Toni Amend escitalopram (LEXAPRO) 10 MG tablet Take 1 tablet (10 mg total) by mouth daily. (Patient not taking: Reported on 11/04/2019)  . metoprolol tartrate (LOPRESSOR) 100 MG tablet Take 1 tablet (100 mg total) by mouth once for 1 dose. Take 2 hours prior to CT.  Marland Kitchen Norethindrone-Ethinyl Estradiol-Fe Biphas (LO LOESTRIN FE) 1 MG-10 MCG / 10 MCG tablet Take 1 tablet by mouth daily.   No facility-administered medications prior to visit.    Review of Systems  Constitutional: Negative.   Respiratory: Negative.   Gastrointestinal: Positive for nausea.  Neurological: Positive for dizziness and light-headedness.  Psychiatric/Behavioral: Positive for agitation. Negative for confusion, decreased concentration, self-injury, sleep disturbance and suicidal ideas. The patient is nervous/anxious.  Objective    BP 122/88 (BP Location: Left Arm, Patient Position: Sitting, Cuff Size: Normal)   Pulse 70   Temp (!) 96.2 F (35.7 C) (Temporal)   Wt 200 lb 6.4 oz (90.9 kg)   LMP 10/23/2019 Comment: neg preg test today  SpO2 99%   BMI 35.50 kg/m    Physical Exam Constitutional:      Appearance: Normal appearance.  Cardiovascular:     Rate and Rhythm: Normal rate and regular rhythm.     Heart sounds: Normal heart sounds.  Pulmonary:      Effort: Pulmonary effort is normal.     Breath sounds: Normal breath sounds.  Skin:    General: Skin is warm and dry.  Neurological:     General: No focal deficit present.     Mental Status: She is alert and oriented to person, place, and time.  Psychiatric:        Mood and Affect: Mood normal.        Behavior: Behavior normal.       No results found for any visits on 11/04/19.  Assessment & Plan    1. Anxiety Patient had 6 weeks follow-up and at last office visit was prescribed Lexapro 10 MG and stopped taking due to medication causing dizziness, light-headedness and nausea. Patient was prescribed Buspar as below. Patient reports she see a counselor from [facility]. Follow up 6 weeks.  - busPIRone (BUSPAR) 7.5 MG tablet; Take 1 tablet (7.5 mg total) by mouth 2 (two) times daily.  Dispense: 90 tablet; Refill: 1  2. Depression, unspecified depression type  - busPIRone (BUSPAR) 7.5 MG tablet; Take 1 tablet (7.5 mg total) by mouth 2 (two) times daily.  Dispense: 90 tablet; Refill: 1  3. Need for diphtheria-tetanus-pertussis (Tdap) vaccine  - Tdap vaccine greater than or equal to 7yo IM   Return in about 6 weeks (around 12/16/2019) for anxiety and depression.      ITrey Sailors, PA-C, have reviewed all documentation for this visit. The documentation on 11/05/19 for the exam, diagnosis, procedures, and orders are all accurate and complete.    Maryella Shivers  [facility] [phone] (phone) [phone] (fax)  [facility]

## 2019-11-17 ENCOUNTER — Telehealth (HOSPITAL_COMMUNITY): Payer: Self-pay | Admitting: *Deleted

## 2019-11-17 NOTE — Telephone Encounter (Signed)
Reaching out to patient to offer assistance regarding upcoming cardiac imaging study; pt verbalizes understanding of appt date/time, parking situation and where to check in, pre-test NPO status and medications ordered, and verified current allergies; name and call back number provided for further questions should they arise  Desiree Abshier Tai RN Navigator Cardiac Imaging [facility] [phone] office [phone] cell 

## 2019-11-18 ENCOUNTER — Other Ambulatory Visit: Payer: Self-pay | Admitting: Family

## 2019-11-18 ENCOUNTER — Ambulatory Visit
Admission: RE | Admit: 2019-11-18 | Discharge: 2019-11-18 | Disposition: A | Discharge status: Home / Self Care | Payer: No Typology Code available for payment source | Source: Ambulatory Visit | Attending: Family | Admitting: Family

## 2019-11-18 ENCOUNTER — Other Ambulatory Visit: Payer: Self-pay

## 2019-11-18 DIAGNOSIS — R9431 Abnormal electrocardiogram [ECG] [EKG]: Secondary | ICD-10-CM | POA: Insufficient documentation

## 2019-11-18 DIAGNOSIS — R072 Precordial pain: Secondary | ICD-10-CM | POA: Diagnosis not present

## 2019-11-18 DIAGNOSIS — Z8249 Family history of ischemic heart disease and other diseases of the circulatory system: Secondary | ICD-10-CM

## 2019-11-18 MED ORDER — METOPROLOL TARTRATE 5 MG/5ML IV SOLN
10.0000 mg | Freq: Once | INTRAVENOUS | Status: AC
  Administered 2019-11-18: 10 mg via INTRAVENOUS

## 2019-11-18 MED ORDER — NITROGLYCERIN 0.4 MG SL SUBL
0.8000 mg | SUBLINGUAL_TABLET | Freq: Once | SUBLINGUAL | Status: AC
  Administered 2019-11-18: 0.8 mg via SUBLINGUAL

## 2019-11-18 MED ORDER — DILTIAZEM HCL 25 MG/5ML IV SOLN
10.0000 mg | Freq: Once | INTRAVENOUS | Status: AC
  Administered 2019-11-18: 10 mg via INTRAVENOUS

## 2019-11-18 MED ORDER — IOHEXOL 350 MG/ML SOLN
85.0000 mL | Freq: Once | INTRAVENOUS | Status: AC | PRN
  Administered 2019-11-18: 85 mL via INTRAVENOUS

## 2019-11-18 NOTE — Progress Notes (Signed)
Patient tolerated well. Unable to complete scan due to HR. Dr. Myriam Forehand aware. Ambulate w/o difficulty. Sitting in chair drinking. No needs. All questions answered. ABC intact. Discharge from procedure area w/o issues.

## 2019-11-19 ENCOUNTER — Telehealth: Payer: Self-pay | Admitting: Cardiology

## 2019-11-19 DIAGNOSIS — R072 Precordial pain: Secondary | ICD-10-CM

## 2019-11-19 NOTE — Telephone Encounter (Signed)
Secure chat received from Dr. Azucena Cecil on 11/18/19. He was notified by the Cardiac CT tech the patient's heart rate was too high for the scanner and her test will need to be rescheduled.  Another Cardiac CT order will need to be placed. Per Dr. Azucena Cecil, the patient will need to take Ivabradine 10 mg x 1 dose & metoprolol tartrate 100 mg x 1 dose 2 hours prior to her Cardiac CT.    Cardiac Ct order has been placed again.  Will need to follow up with the patient next week about new pre-medication orders.

## 2019-11-23 NOTE — Telephone Encounter (Signed)
Patients Cardiac CT has been rescheduled for 11/25/19.  Attempted to contact the patient to give Dr. Merita Norton pre-test medication instructions. Unable to lmom. Patients phone rings out.

## 2019-11-24 ENCOUNTER — Ambulatory Visit: Payer: No Typology Code available for payment source | Admitting: Family

## 2019-11-24 ENCOUNTER — Other Ambulatory Visit: Payer: Self-pay | Admitting: Family

## 2019-11-24 ENCOUNTER — Telehealth (HOSPITAL_COMMUNITY): Payer: Self-pay | Admitting: *Deleted

## 2019-11-24 MED ORDER — METOPROLOL TARTRATE 100 MG PO TABS: 100.0000 mg | ORAL_TABLET | Freq: Once | ORAL | 0 refills | Status: DC

## 2019-11-24 MED ORDER — IVABRADINE HCL 5 MG PO TABS: ORAL_TABLET | ORAL | 0 refills | Status: DC

## 2019-11-24 NOTE — Progress Notes (Signed)
ivabradine 10mg  po x 1, metoprolol 100mg  po x 1.

## 2019-11-24 NOTE — Telephone Encounter (Signed)
Reaching out to patient to offer assistance regarding upcoming cardiac imaging study; pt verbalizes understanding of appt date/time, parking situation and where to check in, pre-test NPO status and medications ordered, and verified current allergies; name and call back number provided for further questions should they arise  Juanetta Negash Tai RN Navigator Cardiac Imaging [facility] [phone] office [phone] cell 

## 2019-11-24 NOTE — Telephone Encounter (Signed)
No answer. Left detailed message, ok per DPR. Stating that ivabradine and metoprolol are at her pharmacy to pick up so she can take them 2 hours prior to CT.  I asked her to call back if she had any further questions and to let us know she got this message or not.

## 2019-11-25 ENCOUNTER — Other Ambulatory Visit: Payer: Self-pay

## 2019-11-25 ENCOUNTER — Ambulatory Visit
Admission: RE | Admit: 2019-11-25 | Discharge: 2019-11-25 | Disposition: A | Discharge status: Home / Self Care | Payer: No Typology Code available for payment source | Source: Ambulatory Visit | Attending: Family | Admitting: Family

## 2019-11-25 DIAGNOSIS — R072 Precordial pain: Secondary | ICD-10-CM | POA: Insufficient documentation

## 2019-11-25 MED ORDER — IOHEXOL 350 MG/ML SOLN
100.0000 mL | Freq: Once | INTRAVENOUS | Status: AC | PRN
  Administered 2019-11-25: 85 mL via INTRAVENOUS

## 2019-11-25 MED ORDER — METOPROLOL TARTRATE 5 MG/5ML IV SOLN
10.0000 mg | Freq: Once | INTRAVENOUS | Status: AC
  Administered 2019-11-25: 10 mg via INTRAVENOUS

## 2019-11-25 MED ORDER — ONDANSETRON HCL 4 MG/2ML IJ SOLN
4.0000 mg | Freq: Once | INTRAMUSCULAR | Status: AC
  Administered 2019-11-25: 4 mg via INTRAVENOUS

## 2019-11-25 MED ORDER — NITROGLYCERIN 0.4 MG SL SUBL
0.8000 mg | SUBLINGUAL_TABLET | Freq: Once | SUBLINGUAL | Status: AC
  Administered 2019-11-25: 0.8 mg via SUBLINGUAL

## 2019-11-25 NOTE — Telephone Encounter (Signed)
The patient was rescheduled for her Cardiac CT this morning. Per Coleen, RN for Dr. Azucena Cecil, he had been advised the patient did not pick up her Corlanor for her scan this morning.  He advised her to come to the office to pick up samples of Corlanor to take this morning.  Will close this encounter.

## 2019-11-25 NOTE — Progress Notes (Signed)
Patient tolerated procedure well. Ambulate w/o difficulty. Sitting in chair drinking. No needs. All questions answered. ABC intact. Discharge from procedure area w/o issues. 

## 2019-11-27 ENCOUNTER — Other Ambulatory Visit: Payer: Self-pay | Admitting: Family

## 2019-11-28 ENCOUNTER — Other Ambulatory Visit: Payer: Self-pay | Admitting: Family

## 2019-11-29 ENCOUNTER — Other Ambulatory Visit: Payer: Self-pay | Admitting: Family

## 2019-12-02 ENCOUNTER — Other Ambulatory Visit: Payer: Self-pay

## 2019-12-02 ENCOUNTER — Ambulatory Visit (INDEPENDENT_AMBULATORY_CARE_PROVIDER_SITE_OTHER): Payer: No Typology Code available for payment source | Admitting: Family

## 2019-12-02 ENCOUNTER — Encounter: Payer: Self-pay | Admitting: Family

## 2019-12-02 VITALS — BP 108/88 | HR 77 | Ht 63.0 in | Wt 198.0 lb

## 2019-12-02 DIAGNOSIS — R002 Palpitations: Secondary | ICD-10-CM | POA: Diagnosis not present

## 2019-12-02 DIAGNOSIS — K219 Gastro-esophageal reflux disease without esophagitis: Secondary | ICD-10-CM | POA: Diagnosis not present

## 2019-12-02 DIAGNOSIS — Z8249 Family history of ischemic heart disease and other diseases of the circulatory system: Secondary | ICD-10-CM | POA: Diagnosis not present

## 2019-12-02 MED ORDER — PROPRANOLOL HCL 20 MG PO TABS: 20.0000 mg | ORAL_TABLET | Freq: Two times a day (BID) | ORAL | 2 refills | Status: DC | PRN

## 2019-12-02 NOTE — Progress Notes (Signed)
Office Visit    Patient Name: Desiree Collins Date of Encounter: 12/02/2019  Primary Care Provider:  Trinna Post, PA-C Primary Cardiologist:  Ida Rogue, MD Electrophysiologist:  None   Chief Complaint    Desiree Collins is a 34 y.o. female with a hx of palpitations, asthma, family history of premature coronary artery disease presents today for follow up after cardiac testing.   Past Medical History    Past Medical History:  Diagnosis Date  . Asthma   . BRCA negative 05/2019   MyRisk neg except AXIN2, BMPR1A, CHEK2, MSH3, POLE VUS  . Family history of breast cancer   . Family history of ovarian cancer   . Increased risk of breast cancer 05/2019   IBIS=23.7%   Past Surgical History:  Procedure Laterality Date  . LEEP    . TUBAL LIGATION      Allergies  Allergies  Allergen Reactions  . Chocolate Hives  . Morphine Hives    History of Present Illness    Desiree Collins is a 34 y.o. female with a hx of palpitations, asthma, family history of premature coronary disease last seen 09/23/2019.  Previous cardiac workup including loop recorder and tilt table were reportedly without acute findings. Wore ZIO monitor 04/13/19 with NSR, infrequent PVC/PAC  When seen in clinic 09/23/2019 she noted chest pain, shortness of breath, cough, palpitations.  She had been seen in urgent care for URI symptoms with noted bradycardia 51 bpm-EKG unfortunately unavailable for my review.  She was started on Protonix for GERD symptoms.  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  She has 3 daughters.  Subsequent testing with ZIO monitor 09/07/2019 with predominantly sinus rhythm average heart rate 80 bpm with rare PVC/PAC (less than 1%) and patient triggered events not associated with significant arrhythmia.  She had cardiac CT 11/25/2019 with calcium score of 0.  She was seen in ED in June for abdominal pain associated with nausea, vomiting,  diarrhea.  CT abdomen and pelvis with no acute findings.  Seen by PCP for anxiety and as she did not tolerate Lexapro she was switched to Buspar.  Reports no recurrent chest pain, pressure, tightness.  Reports significant improvement in her indigestion symptoms since starting Protonix.  She reports continued palpitations triggered by exercise or stress.  We reviewed her ZIO monitor and cardiac CT in depth.  EKGs/Labs/Other Studies Reviewed:   The following studies were reviewed today:  ZIO 08/2019 Normal sinus rhythm Average heart rate 80 bpm   Patient triggered events were not associated with significant arrhythmia   Isolated SVEs were rare (<1.0%), SVE Couplets were rare (<1.0%), and SVE Triplets were rare (<1.0%). Isolated VEs were rare (<1.0%), and no VE Couplets or VE Triplets were present.  Cardiac CT 11/25/19 IMPRESSION: 1. Coronary calcium previously calculated.   2. Normal coronary origin with right dominance.   3. No evidence of CAD.   4. CAD-RADS 0. No evidence of CAD (0%). Consider non-atherosclerotic causes of chest pain.  EKG:  EKG is ordered today.  The ekg ordered today demonstrates NSR 99 bpmw with prolonged QT 370/QTc 474, stable TWI leads V1-V6.  Recent Labs: 04/09/2019: TSH 1.210 10/25/2019: ALT 18; BUN 12; Creatinine, Ser 0.79; Hemoglobin 10.8; Platelets 454; Potassium 3.6; Sodium 139  Recent Lipid Panel    Component Value Date/Time   CHOL 203 (H) 04/09/2019 1113   TRIG 81 04/09/2019 1113   HDL 56 04/09/2019 1113   CHOLHDL  3.6 04/09/2019 1113   LDLCALC 132 (H) 04/09/2019 1113    Home Medications   Current Meds  Medication Sig  . busPIRone (BUSPAR) 7.5 MG tablet Take 1 tablet (7.5 mg total) by mouth 2 (two) times daily.  Marland Kitchen dicyclomine (BENTYL) 10 MG capsule Take 1 capsule (10 mg total) by mouth 4 (four) times daily for 14 days.  Marland Kitchen escitalopram (LEXAPRO) 10 MG tablet Take 1 tablet (10 mg total) by mouth daily.  . ondansetron (ZOFRAN ODT) 4 MG  disintegrating tablet Take 1 tablet (4 mg total) by mouth every 8 (eight) hours as needed for nausea or vomiting.  . pantoprazole (PROTONIX) 40 MG tablet TAKE 1 TABLET(40 MG) BY MOUTH DAILY  . sucralfate (CARAFATE) 1 g tablet Take 1 tablet (1 g total) by mouth 4 (four) times daily -  with meals and at bedtime.    Review of Systems  Review of Systems  Constitutional: Negative for chills, fever and malaise/fatigue.  Cardiovascular: Positive for palpitations. Negative for chest pain, dyspnea on exertion, leg swelling, near-syncope, orthopnea and syncope.  Respiratory: Negative for cough, shortness of breath and wheezing.   Gastrointestinal: Negative for nausea and vomiting.  Neurological: Negative for dizziness, light-headedness and weakness.   All other systems reviewed and are otherwise negative except as noted above.  Physical Exam   VS:  BP 108/88 (BP Location: Left Arm, Patient Position: Sitting, Cuff Size: Normal)   Pulse 77   Ht '5\' 3"'  (1.6 m)   Wt 198 lb (89.8 kg)   SpO2 98%   BMI 35.07 kg/m  , BMI Body mass index is 35.07 kg/m. GEN: Well nourished, overweight, well developed, in no acute distress. HEENT: normal. Neck: Supple, no JVD, carotid bruits, or masses. Cardiac: RRR, no murmurs, rubs, or gallops. No clubbing, cyanosis, edema.  Radials/DP/PT 2+ and equal bilaterally.  Respiratory:  Respirations regular and unlabored, clear to auscultation bilaterally. GI: Soft, nontender, nondistended, BS + x 4. MS: No deformity or atrophy. Skin: Warm and dry, no rash. Neuro:  Strength and sensation are intact. Psych: Normal affect.  Assessment & Plan   1. Palpitations/Bradycardia/Prolonged QT - Prolonged QTc 474 on EKG 08/2019 now resolved. 11/15/19 ZIO monitor showed predominantly NSR with average HR of 80bpm with rare (<1%) PVC/PAC and no significant arrhythmia.  No significant bradycardia on monitor.  She does report continued intermittent palpitations.  We reviewed triggers  including caffeine, stress.  Reviewed that likely her palpitations since to during exercise are more so related to a sinus tachycardia which is a normal finding.  Rx propanolol 20 mg twice daily as needed for palpitations.  2. Chest pain/Family history of CAD - Family history notable for premature CAD with her mother having her first MI at 26 years old. 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 Cardiac CT 10/2019 with calcium score of 0.  No evidence of coronary disease.  Encourage primary prevention via regular cardiovascular exercise and low-sodium, heart healthy diet.  3. GERD -improved since addition of Protonix 40 mg daily.  Recommend avoid fried, greasy, acidic foods.   Disposition: Follow up in 6 month(s) with Dr. Rockey Situ or APP   Loel Dubonnet, NP 12/02/2019, 10:51 AM

## 2019-12-02 NOTE — Patient Instructions (Signed)
Medication Instructions:  Your physician has recommended you make the following change in your medication:   START Propranolol 20mg  as needed twice-daily for palpitations  *If you need a refill on your cardiac medications before your next appointment, please call your pharmacy*   Lab Work: None ordered today.  Testing/Procedures: Your EKG today shows normal sinus rhythm.   Your Cardiac CT showed no plaque in your coronary arteries.   Your ZIO monitor showed an occasional early heart beat which are not dangerous.   Follow-Up: At [facility], you and your health needs are our priority.  As part of our continuing mission to provide you with exceptional heart care, we have created designated Provider Care Teams.  These Care Teams include your primary Cardiologist (physician) and Advanced Practice Providers (APPs -  Physician Assistants and Nurse Practitioners) who all work together to provide you with the care you need, when you need it.  We recommend signing up for the patient portal called "MyChart".  Sign up information is provided on this After Visit Summary.  MyChart is used to connect with patients for Virtual Visits (Telemedicine).  Patients are able to view lab/test results, encounter notes, upcoming appointments, etc.  Non-urgent messages can be sent to your provider as well.   To learn more about what you can do with MyChart, go to [facility].    Your next appointment:   6 month(s)  The format for your next appointment:   In Person  Provider:   You may see [email or web address], MD or one of the following Advanced Practice Providers on your designated Care Team:    Julien Nordmann, NP  Nicolasa Ducking, PA-C  Eula Listen, PA-C  Marisue Ivan, NP  Other Instructions  Palpitations Palpitations are feelings that your heartbeat is not normal. Your heartbeat may feel like it is:  Uneven.  Faster than normal.  Fluttering.  Skipping a beat. This is  usually not a serious problem. In some cases, you may need tests to rule out any serious problems. Follow these instructions at home: Pay attention to any changes in your condition. Take these actions to help manage your symptoms: Eating and drinking  Avoid: ? Coffee, tea, soft drinks, and energy drinks. ? Chocolate. ? Alcohol. ? Diet pills. Lifestyle   Try to lower your stress. These things can help you relax: ? Yoga. ? Deep breathing and meditation. ? Exercise. ? Using words and images to create positive thoughts (guided imagery). ? Using your mind to control things in your body (biofeedback).  Do not use drugs.  Get plenty of rest and sleep. Keep a regular bed time. General instructions   Take over-the-counter and prescription medicines only as told by your doctor.  Do not use any products that contain nicotine or tobacco, such as cigarettes and e-cigarettes. If you need help quitting, ask your doctor.  Keep all follow-up visits as told by your doctor. This is important. You may need more tests if palpitations do not go away or get worse. Contact a doctor if:  Your symptoms last more than 24 hours.  Your symptoms occur more often. Get help right away if you:  Have chest pain.  Feel short of breath.  Have a very bad headache.  Feel dizzy.  Pass out (faint). Summary  Palpitations are feelings that your heartbeat is uneven or faster than normal. It may feel like your heart is fluttering or skipping a beat.  Avoid food and drinks that may cause palpitations. These include caffeine,  chocolate, and alcohol.  Try to lower your stress. Do not smoke or use drugs.  Get help right away if you faint or have chest pain, shortness of breath, a severe headache, or dizziness. This information is not intended to replace advice given to you by your health care provider. Make sure you discuss any questions you have with your health care provider. Document Revised: 05/28/2017  Document Reviewed: 05/28/2017 Elsevier Patient Education  2020 [employer].

## 2019-12-19 ENCOUNTER — Other Ambulatory Visit: Payer: Self-pay | Admitting: Physician Assistant

## 2019-12-19 DIAGNOSIS — F320 Major depressive disorder, single episode, mild: Secondary | ICD-10-CM

## 2019-12-19 NOTE — Telephone Encounter (Signed)
Requested Prescriptions  Pending Prescriptions Disp Refills   escitalopram (LEXAPRO) 10 MG tablet [Pharmacy Med Name: ESCITALOPRAM 10MG  TABLETS] 90 tablet 0    Sig: TAKE 1 TABLET(10 MG) BY MOUTH DAILY     Psychiatry:  Antidepressants - SSRI Passed - 12/19/2019  3:39 AM      Passed - Valid encounter within last 6 months    Recent Outpatient Visits          1 month ago Anxiety   [facility] [place], Adriana M, PA-C   2 months ago Depression, major, single episode, mild [facility])   [facility] [place], Trojane M, PA-C   8 months ago History of loop electrosurgical excision procedure (LEEP) of cervix affecting pregnancy, antepartum   [facility] [place], Trojane, PA-C      Future Appointments            In 5 months Gollan, Lavella Hammock, MD [facility]

## 2020-01-25 ENCOUNTER — Other Ambulatory Visit: Payer: Self-pay | Admitting: Obstetrics and Gynecology

## 2020-01-25 DIAGNOSIS — Z1231 Encounter for screening mammogram for malignant neoplasm of breast: Secondary | ICD-10-CM

## 2020-01-27 ENCOUNTER — Ambulatory Visit
Admission: RE | Admit: 2020-01-27 | Discharge: 2020-01-27 | Disposition: A | Discharge status: Home / Self Care | Payer: No Typology Code available for payment source | Source: Ambulatory Visit | Attending: Obstetrics and Gynecology | Admitting: Obstetrics and Gynecology

## 2020-01-27 ENCOUNTER — Other Ambulatory Visit: Payer: Self-pay

## 2020-01-27 DIAGNOSIS — Z1231 Encounter for screening mammogram for malignant neoplasm of breast: Secondary | ICD-10-CM | POA: Insufficient documentation

## 2020-01-27 MED ORDER — GADOBUTROL 1 MMOL/ML IV SOLN
9.0000 mL | Freq: Once | INTRAVENOUS | Status: AC | PRN
  Administered 2020-01-27: 9 mL via INTRAVENOUS

## 2020-02-17 ENCOUNTER — Encounter: Payer: Self-pay | Admitting: Physician Assistant

## 2020-02-17 ENCOUNTER — Ambulatory Visit (INDEPENDENT_AMBULATORY_CARE_PROVIDER_SITE_OTHER): Payer: No Typology Code available for payment source | Admitting: Physician Assistant

## 2020-02-17 ENCOUNTER — Other Ambulatory Visit: Payer: Self-pay

## 2020-02-17 VITALS — BP 124/86 | HR 76 | Temp 98.6°F | Resp 16 | Ht 63.0 in | Wt 200.0 lb

## 2020-02-17 DIAGNOSIS — I479 Paroxysmal tachycardia, unspecified: Secondary | ICD-10-CM | POA: Diagnosis not present

## 2020-02-17 DIAGNOSIS — J4520 Mild intermittent asthma, uncomplicated: Secondary | ICD-10-CM

## 2020-02-17 MED ORDER — ALBUTEROL SULFATE (2.5 MG/3ML) 0.083% IN NEBU: 2.5000 mg | INHALATION_SOLUTION | Freq: Four times a day (QID) | RESPIRATORY_TRACT | 1 refills | Status: DC | PRN

## 2020-02-17 MED ORDER — FLUTICASONE-SALMETEROL 100-50 MCG/DOSE IN AEPB: 1.0000 | INHALATION_SPRAY | Freq: Two times a day (BID) | RESPIRATORY_TRACT | 3 refills | Status: DC

## 2020-02-17 MED ORDER — ALBUTEROL SULFATE HFA 108 (90 BASE) MCG/ACT IN AERS: 2.0000 | INHALATION_SPRAY | Freq: Four times a day (QID) | RESPIRATORY_TRACT | 2 refills | Status: DC | PRN

## 2020-02-17 NOTE — Progress Notes (Signed)
Established patient visit   Patient: Desiree Collins   DOB: 05-01-1985   34 y.o. Female  MRN: [id number] Visit Date: 02/17/2020  Today's healthcare provider: Trey Sailors, PA-C   Chief Complaint  Patient presents with  . Asthma   Subjective    HPI  Patient presents today with issues with asthma. She reports that this is a chronic issue. She is out of her medication. She reports that she has used her daughter's nebulizer treatments and it has helped. She does not remember the name of the inhaler she was prescribed. She reports ever since she got COVID 11/2019 she has had increasing trouble breathing. She reports SOB with activity. She reports night time awakenings from SOB.      Medications: Outpatient Medications Prior to Visit  Medication Sig  . ondansetron (ZOFRAN ODT) 4 MG disintegrating tablet Take 1 tablet (4 mg total) by mouth every 8 (eight) hours as needed for nausea or vomiting.  . pantoprazole (PROTONIX) 40 MG tablet TAKE 1 TABLET(40 MG) BY MOUTH DAILY  . propranolol (INDERAL) 20 MG tablet Take 1 tablet (20 mg total) by mouth 2 (two) times daily as needed (as needed for palpitations).  . busPIRone (BUSPAR) 7.5 MG tablet Take 1 tablet (7.5 mg total) by mouth 2 (two) times daily.  Marland Kitchen dicyclomine (BENTYL) 10 MG capsule Take 1 capsule (10 mg total) by mouth 4 (four) times daily for 14 days.  Marland Kitchen escitalopram (LEXAPRO) 10 MG tablet TAKE 1 TABLET(10 MG) BY MOUTH DAILY  . sucralfate (CARAFATE) 1 g tablet Take 1 tablet (1 g total) by mouth 4 (four) times daily -  with meals and at bedtime.   No facility-administered medications prior to visit.    Review of Systems  Constitutional: Negative.   Respiratory: Positive for shortness of breath.   Cardiovascular: Negative.   Neurological: Negative.       Objective    BP 124/86   Pulse 76   Temp 98.6 F (37 C)   Resp 16   Ht 5\' 3"  (1.6 m)   Wt 200 lb (90.7 kg)   BMI 35.43 kg/m    Physical Exam Constitutional:       Appearance: Normal appearance.  Cardiovascular:     Rate and Rhythm: Normal rate and regular rhythm.     Heart sounds: Normal heart sounds.  Pulmonary:     Effort: Pulmonary effort is normal.     Breath sounds: Normal breath sounds.  Skin:    General: Skin is warm and dry.  Neurological:     Mental Status: She is alert and oriented to person, place, and time. Mental status is at baseline.  Psychiatric:        Mood and Affect: Mood normal.        Behavior: Behavior normal.       No results found for any visits on 02/17/20.  Assessment & Plan    1. Mild intermittent asthma without complication  - Fluticasone-Salmeterol (ADVAIR) 100-50 MCG/DOSE AEPB; Inhale 1 puff into the lungs 2 (two) times daily.  Dispense: 1 each; Refill: 3 - albuterol (VENTOLIN HFA) 108 (90 Base) MCG/ACT inhaler; Inhale 2 puffs into the lungs every 6 (six) hours as needed for wheezing or shortness of breath.  Dispense: 1 each; Refill: 2 - albuterol (PROVENTIL) (2.5 MG/3ML) 0.083% nebulizer solution; Take 3 mLs (2.5 mg total) by nebulization every 6 (six) hours as needed for wheezing or shortness of breath.  Dispense: 150 mL; Refill: 1  2. Paroxysmal tachycardia (HCC)    Return if symptoms worsen or fail to improve.      ITrey Sailors, PA-C, have reviewed all documentation for this visit. The documentation on 02/18/20 for the exam, diagnosis, procedures, and orders are all accurate and complete.  The entirety of the information documented in the History of Present Illness, Review of Systems and Physical Exam were personally obtained by me. Portions of this information were initially documented by Anson [place], CMA and reviewed by me for thoroughness and accuracy.      Maryella Shivers  [facility] [phone] (phone) [phone] (fax)  [facility]

## 2020-02-17 NOTE — Patient Instructions (Signed)
Call insurance and check which medical supply store they prefer.

## 2020-02-19 ENCOUNTER — Other Ambulatory Visit: Payer: Self-pay | Admitting: Family

## 2020-02-27 ENCOUNTER — Other Ambulatory Visit: Payer: Self-pay | Admitting: Family

## 2020-02-28 NOTE — Telephone Encounter (Signed)
Rx request sent to pharmacy.  

## 2020-05-31 ENCOUNTER — Ambulatory Visit: Payer: No Typology Code available for payment source | Admitting: Cardiovascular Disease

## 2020-06-05 ENCOUNTER — Ambulatory Visit: Payer: No Typology Code available for payment source | Admitting: Family

## 2020-06-05 NOTE — Progress Notes (Deleted)
Office Visit    Patient Name: Desiree Collins Date of Encounter: 06/05/2020  Primary Care Provider:  Trinna Post, PA-C Primary Cardiologist:  Ida Rogue, MD Electrophysiologist:  None   Chief Complaint    Desiree Collins is a 35 y.o. female with a hx of palpitations, asthma, family history of premature coronary artery disease presents today for follow up of palpitations.  Past Medical History    Past Medical History:  Diagnosis Date  . Asthma   . BRCA negative 05/2019   MyRisk neg except AXIN2, BMPR1A, CHEK2, MSH3, POLE VUS  . Family history of breast cancer   . Family history of ovarian cancer   . Increased risk of breast cancer 05/2019   IBIS=23.7%   Past Surgical History:  Procedure Laterality Date  . LEEP    . TUBAL LIGATION      Allergies  Allergies  Allergen Reactions  . Chocolate Hives  . Morphine Hives    History of Present Illness    Desiree Collins is a 35 y.o. female with a hx of palpitations, asthma, family history of premature coronary disease last seen 12/02/19  Previous cardiac workup including loop recorder and tilt table were reportedly without acute findings. Wore ZIO monitor 04/13/19 with NSR, infrequent PVC/PAC  When seen in clinic 09/23/2019 she noted chest pain, shortness of breath, cough, palpitations.  She had been seen in urgent care for URI symptoms with noted bradycardia 51 bpm-EKG unfortunately unavailable for my review.  She was started on Protonix for GERD symptoms.  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  She has 3 daughters.  Subsequent testing with ZIO monitor 09/07/2019 with predominantly sinus rhythm average heart rate 80 bpm with rare PVC/PAC (less than 1%) and patient triggered events not associated with significant arrhythmia.  She had cardiac CT 11/25/2019 with calcium score of 0.  She was seen in ED in June for abdominal pain associated with nausea, vomiting, diarrhea.  CT  abdomen and pelvis with no acute findings.  Seen by PCP for anxiety and as she did not tolerate Lexapro she was switched to Buspar.  Reports no recurrent chest pain, pressure, tightness.  Reports significant improvement in her indigestion symptoms since starting Protonix.  She reports continued palpitations triggered by exercise or stress.  We reviewed her ZIO monitor and cardiac CT in depth.  ***  EKGs/Labs/Other Studies Reviewed:   The following studies were reviewed today:  ZIO 08/2019 Normal sinus rhythm Average heart rate 80 bpm   Patient triggered events were not associated with significant arrhythmia   Isolated SVEs were rare (<1.0%), SVE Couplets were rare (<1.0%), and SVE Triplets were rare (<1.0%). Isolated VEs were rare (<1.0%), and no VE Couplets or VE Triplets were present.  Cardiac CT 11/25/19 IMPRESSION: 1. Coronary calcium previously calculated.   2. Normal coronary origin with right dominance.   3. No evidence of CAD.   4. CAD-RADS 0. No evidence of CAD (0%). Consider non-atherosclerotic causes of chest pain.  EKG:  EKG is ordered today.  The ekg ordered today demonstrates NSR 99 bpmw with prolonged QT 370/QTc 474, stable TWI leads V1-V6.***  Recent Labs: 10/25/2019: ALT 18; BUN 12; Creatinine, Ser 0.79; Hemoglobin 10.8; Platelets 454; Potassium 3.6; Sodium 139  Recent Lipid Panel    Component Value Date/Time   CHOL 203 (H) 04/09/2019 1113   TRIG 81 04/09/2019 1113   HDL 56 04/09/2019 1113   CHOLHDL 3.6 04/09/2019 1113  [place] 132 (H) 04/09/2019 1113    Home Medications   No outpatient medications have been marked as taking for the 06/05/20 encounter (Appointment) with Loel Dubonnet, NP.    Review of Systems  *** All other systems reviewed and are otherwise negative except as noted above.  Physical Exam  *** VS:  There were no vitals taken for this visit. , BMI There is no height or weight on file to calculate BMI. GEN: Well nourished,  overweight, well developed, in no acute distress. HEENT: normal. Neck: Supple, no JVD, carotid bruits, or masses. Cardiac: RRR, no murmurs, rubs, or gallops. No clubbing, cyanosis, edema.  Radials/DP/PT 2+ and equal bilaterally.  Respiratory:  Respirations regular and unlabored, clear to auscultation bilaterally. GI: Soft, nontender, nondistended, BS + x 4. MS: No deformity or atrophy. Skin: Warm and dry, no rash. Neuro:  Strength and sensation are intact. Psych: Normal affect.  Assessment & Plan   1. Palpitations/Bradycardia/Prolonged QT - Prolonged QTc 474 on EKG 08/2019 now resolved. 11/15/19 ZIO monitor showed predominantly NSR with average HR of 80bpm with rare (<1%) PVC/PAC and no significant arrhythmia.  No significant bradycardia on monitor.  She does report continued intermittent palpitations.  We reviewed triggers including caffeine, stress.  Reviewed that likely her palpitations since to during exercise are more so related to a sinus tachycardia which is a normal finding.  Rx propanolol 20 mg twice daily as needed for palpitations.***  2. Chest pain/Family history of CAD - Family history notable for premature CAD with her mother having her first MI at 9 years old. 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 Cardiac CT 10/2019 with calcium score of 0.  No evidence of coronary disease.  Encourage primary prevention via regular cardiovascular exercise and low-sodium, heart healthy diet.***  3. GERD -improved since addition of Protonix 40 mg daily.  Recommend avoid fried, greasy, acidic foods. ***  Disposition: Follow up*** in 6 month(s) with Dr. Rockey Situ or APP   Loel Dubonnet, NP 06/05/2020, 9:18 AM

## 2020-06-06 ENCOUNTER — Encounter: Payer: Self-pay | Admitting: Family

## 2020-06-17 ENCOUNTER — Other Ambulatory Visit: Payer: Self-pay | Admitting: Physician Assistant

## 2020-06-17 DIAGNOSIS — J4520 Mild intermittent asthma, uncomplicated: Secondary | ICD-10-CM

## 2020-07-13 ENCOUNTER — Encounter: Payer: Self-pay | Admitting: Physician Assistant

## 2020-07-13 ENCOUNTER — Ambulatory Visit (INDEPENDENT_AMBULATORY_CARE_PROVIDER_SITE_OTHER): Payer: No Typology Code available for payment source | Admitting: Physician Assistant

## 2020-07-13 ENCOUNTER — Other Ambulatory Visit: Payer: Self-pay | Admitting: Physician Assistant

## 2020-07-13 ENCOUNTER — Other Ambulatory Visit: Payer: Self-pay

## 2020-07-13 VITALS — BP 129/86 | HR 86 | Temp 98.3°F | Ht 63.0 in | Wt 194.6 lb

## 2020-07-13 DIAGNOSIS — Z9851 Tubal ligation status: Secondary | ICD-10-CM

## 2020-07-13 DIAGNOSIS — Z114 Encounter for screening for human immunodeficiency virus [HIV]: Secondary | ICD-10-CM

## 2020-07-13 DIAGNOSIS — F32A Depression, unspecified: Secondary | ICD-10-CM

## 2020-07-13 DIAGNOSIS — F419 Anxiety disorder, unspecified: Secondary | ICD-10-CM

## 2020-07-13 DIAGNOSIS — Z0000 Encounter for general adult medical examination without abnormal findings: Secondary | ICD-10-CM

## 2020-07-13 DIAGNOSIS — F320 Major depressive disorder, single episode, mild: Secondary | ICD-10-CM | POA: Insufficient documentation

## 2020-07-13 DIAGNOSIS — Z1159 Encounter for screening for other viral diseases: Secondary | ICD-10-CM

## 2020-07-13 DIAGNOSIS — J4520 Mild intermittent asthma, uncomplicated: Secondary | ICD-10-CM

## 2020-07-13 DIAGNOSIS — I479 Paroxysmal tachycardia, unspecified: Secondary | ICD-10-CM

## 2020-07-13 MED ORDER — ALBUTEROL SULFATE (2.5 MG/3ML) 0.083% IN NEBU: 2.5000 mg | INHALATION_SOLUTION | Freq: Four times a day (QID) | RESPIRATORY_TRACT | 1 refills | Status: AC | PRN

## 2020-07-13 MED ORDER — BUSPIRONE HCL 7.5 MG PO TABS: 7.5000 mg | ORAL_TABLET | Freq: Two times a day (BID) | ORAL | 1 refills | Status: AC

## 2020-07-13 MED ORDER — ALBUTEROL SULFATE HFA 108 (90 BASE) MCG/ACT IN AERS: 2.0000 | INHALATION_SPRAY | Freq: Four times a day (QID) | RESPIRATORY_TRACT | 2 refills | Status: AC | PRN

## 2020-07-13 MED ORDER — FLUTICASONE-SALMETEROL 100-50 MCG/DOSE IN AEPB: INHALATION_SPRAY | RESPIRATORY_TRACT | 1 refills | Status: DC

## 2020-07-13 NOTE — Progress Notes (Signed)
Complete physical exam   Patient: Desiree Collins   DOB: 07-26-1985   35 y.o. Female  MRN: [id number] Visit Date: 07/13/2020  Today's healthcare provider: Trinna Post, PA-C   Chief Complaint  Patient presents with  . Annual Exam   Subjective    Desiree Collins is a 35 y.o. female who presents today for a complete physical exam.  She reports consuming a general diet. Gym/ health club routine includes cardio and light weights. She generally feels fairly well. She reports sleeping poorly. She does not have additional problems to discuss today.  HPI   Patient presenting today for annual physical.  She has a history of tubal ligation. Concerned she may have been pregnant last month because she skipped her period February and then experienced heavy bleeding. Really does not want to become pregnant. Became pregnant on Depo shot and also IUD. Has three kids.   Using buspar for depression and anxiety, which is working well for her. Stopped Lexapro. Experiencing some challenges at her current workplace.    Wt Readings from Last 3 Encounters:  07/13/20 194 lb 9.6 oz (88.3 kg)  02/17/20 200 lb (90.7 kg)  12/02/19 198 lb (89.8 kg)     Past Medical History:  Diagnosis Date  . Asthma   . BRCA negative 05/2019   MyRisk neg except AXIN2, BMPR1A, CHEK2, MSH3, POLE VUS  . Family history of breast cancer   . Family history of ovarian cancer   . Increased risk of breast cancer 05/2019   IBIS=23.7%   Past Surgical History:  Procedure Laterality Date  . LEEP    . TUBAL LIGATION     Social History   Socioeconomic History  . Marital status: Married    Spouse name: Not on file  . Number of children: Not on file  . Years of education: Not on file  . Highest education level: Not on file  Occupational History  . Not on file  Tobacco Use  . Smoking status: Never Smoker  . Smokeless tobacco: Never Used  Vaping Use  . Vaping Use: Never used  Substance and Sexual Activity  .  Alcohol use: Yes  . Drug use: Never  . Sexual activity: Yes    Birth control/protection: Surgical    Comment: Tubal ligation   Other Topics Concern  . Not on file  Social History Narrative  . Not on file   Social Determinants of Health   Financial Resource Strain: Low Risk   . Difficulty of Paying Living Expenses: Not hard at all  Food Insecurity: No Food Insecurity  . Worried About Charity fundraiser in the Last Year: Never true  . Ran Out of Food in the Last Year: Never true  Transportation Needs: No Transportation Needs  . Lack of Transportation (Medical): No  . Lack of Transportation (Non-Medical): No  Physical Activity: Not on file  Stress: Stress Concern Present  . Feeling of Stress : Very much  Social Connections: Not on file  Intimate Partner Violence: Not At Risk  . Fear of Current or Ex-Partner: No  . Emotionally Abused: No  . Physically Abused: No  . Sexually Abused: No   Family Status  Relation Name Status  . Mother  Alive  . Father  (Not Specified)  . MGM  Alive  . MGF  Deceased  . [employer]  . PGM  Alive  . Cousin  Alive  . Cousin  Alive   Family History  Problem Relation Age of Onset  . Diabetes Mother   . Breast cancer Mother 13       again at 23  . Ovarian cancer Mother 54  . Hypertension Father   . Breast cancer Maternal Grandmother 39       again at 40  . Cervical cancer Maternal Grandmother   . Bone cancer Maternal Grandfather   . Colon cancer Maternal Grandfather 88  . Breast cancer Maternal Aunt 52  . Breast cancer Paternal Grandmother   . Breast cancer Cousin   . Breast cancer Cousin    Allergies  Allergen Reactions  . Chocolate Hives  . Morphine Hives    Patient Care Team: Paulene Floor as PCP - General (Physician Assistant) Minna Merritts, MD as PCP - Cardiology (Cardiology)   Medications: Outpatient Medications Prior to Visit  Medication Sig  . ondansetron (ZOFRAN ODT) 4 MG disintegrating tablet Take 1  tablet (4 mg total) by mouth every 8 (eight) hours as needed for nausea or vomiting.  . pantoprazole (PROTONIX) 40 MG tablet TAKE 1 TABLET(40 MG) BY MOUTH DAILY  . propranolol (INDERAL) 20 MG tablet TAKE 1 TABLET(20 MG) BY MOUTH TWICE DAILY AS NEEDED FOR PALPITATIONS  . [DISCONTINUED] albuterol (PROVENTIL) (2.5 MG/3ML) 0.083% nebulizer solution Take 3 mLs (2.5 mg total) by nebulization every 6 (six) hours as needed for wheezing or shortness of breath.  . [DISCONTINUED] albuterol (VENTOLIN HFA) 108 (90 Base) MCG/ACT inhaler Inhale 2 puffs into the lungs every 6 (six) hours as needed for wheezing or shortness of breath.  . [DISCONTINUED] busPIRone (BUSPAR) 7.5 MG tablet Take 1 tablet (7.5 mg total) by mouth 2 (two) times daily.  . [DISCONTINUED] WIXELA INHUB 100-50 MCG/DOSE AEPB INHALE 1 PUFF INTO THE LUNGS TWICE DAILY  . dicyclomine (BENTYL) 10 MG capsule Take 1 capsule (10 mg total) by mouth 4 (four) times daily for 14 days.  . sucralfate (CARAFATE) 1 g tablet Take 1 tablet (1 g total) by mouth 4 (four) times daily -  with meals and at bedtime.  . [DISCONTINUED] escitalopram (LEXAPRO) 10 MG tablet TAKE 1 TABLET(10 MG) BY MOUTH DAILY   No facility-administered medications prior to visit.    Review of Systems  Constitutional: Negative.   HENT: Negative.   Eyes: Negative.   Respiratory: Negative.   Cardiovascular: Negative.   Gastrointestinal: Negative.   Endocrine: Negative.   Genitourinary: Negative.   Musculoskeletal: Negative.   Skin: Negative.   Allergic/Immunologic: Negative.   Neurological: Negative.   Hematological: Negative.   Psychiatric/Behavioral: Negative.       Objective    BP 129/86 (BP Location: Left Arm, Patient Position: Sitting, Cuff Size: Normal)   Pulse 86   Temp 98.3 F (36.8 C) (Oral)   Ht _0  (1.6 m)   Wt 194 lb 9.6 oz (88.3 kg)   LMP 06/27/2020 (Exact Date)   SpO2 100%   BMI 34.47 kg/m    Physical Exam Constitutional:      Appearance: Normal  appearance.  HENT:     Right Ear: Tympanic membrane, ear canal and external ear normal.     Left Ear: Tympanic membrane, ear canal and external ear normal.  Cardiovascular:     Rate and Rhythm: Normal rate and regular rhythm.     Pulses: Normal pulses.     Heart sounds: Normal heart sounds.  Pulmonary:     Effort: Pulmonary effort is normal.     Breath sounds: Normal breath sounds.  Abdominal:  General: Abdomen is flat. Bowel sounds are normal.     Palpations: Abdomen is soft.  Skin:    General: Skin is warm and dry.  Neurological:     General: No focal deficit present.     Mental Status: She is alert and oriented to person, place, and time.  Psychiatric:        Mood and Affect: Mood normal.        Behavior: Behavior normal.       Last depression screening scores PHQ 2/9 Scores 07/13/2020 02/17/2020 11/04/2019  PHQ - 2 Score _0 PHQ- 9 Score _1 Last fall risk screening Fall Risk  07/13/2020  Falls in the past year? 1  Number falls in past yr: 1  Injury with Fall? 0  Risk for fall due to : History of fall(s);No Fall Risks  Follow up Falls evaluation completed;Education provided;Falls prevention discussed   Last Audit-C alcohol use screening Alcohol Use Disorder Test (AUDIT) 07/13/2020  1. How often do you have a drink containing alcohol? 3  2. How many drinks containing alcohol do you have on a typical day when you are drinking? 1  3. How often do you have six or more drinks on one occasion? 1  AUDIT-C Score 5  4. How often during the last year have you found that you were not able to stop drinking once you had started? 0  5. How often during the last year have you failed to do what was normally expected from you because of drinking? 0  6. How often during the last year have you needed a first drink in the morning to get yourself going after a heavy drinking session? 0  7. How often during the last year have you had a feeling of guilt of remorse after drinking?  0  8. How often during the last year have you been unable to remember what happened the night before because you had been drinking? 0  9. Have you or someone else been injured as a result of your drinking? 0  10. Has a relative or friend or a doctor or another health worker been concerned about your drinking or suggested you cut down? 0  Alcohol Use Disorder Identification Test Final Score (AUDIT) 5   A score of 3 or more in women, and 4 or more in men indicates increased risk for alcohol abuse, EXCEPT if all of the points are from question 1   Results for orders placed or performed in visit on 07/13/20  HIV Antibody (routine testing w rflx)  Result Value Ref Range   HIV Screen 4th Generation wRfx Non Reactive Non Reactive  TSH  Result Value Ref Range   TSH 0.804 0.450 - 4.500 uIU/mL  Lipid panel  Result Value Ref Range   Cholesterol, Total 230 (H) 100 - 199 mg/dL   Triglycerides 193 (H) 0 - 149 mg/dL   HDL 63 >39 mg/dL   VLDL Cholesterol Cal 34 5 - 40 mg/dL   LDL Chol Calc (NIH) 133 (H) 0 - 99 mg/dL   Chol/HDL Ratio 3.7 0.0 - 4.4 ratio  Comprehensive metabolic panel  Result Value Ref Range   Glucose 90 65 - 99 mg/dL   BUN 18 6 - 20 mg/dL   Creatinine, Ser 1.22 (H) 0.57 - 1.00 mg/dL   eGFR 59 (L) >59 mL/min/1.73   BUN/Creatinine Ratio 15 9 - 23   Sodium 141 134 - 144 mmol/L   Potassium  4.6 3.5 - 5.2 mmol/L   Chloride 103 96 - 106 mmol/L   CO2 23 20 - 29 mmol/L   Calcium 9.3 8.7 - 10.2 mg/dL   Total Protein 7.6 6.0 - 8.5 g/dL   Albumin 4.4 3.8 - 4.8 g/dL   Globulin, Total 3.2 1.5 - 4.5 g/dL   Albumin/Globulin Ratio 1.4 1.2 - 2.2   Bilirubin Total 0.2 0.0 - 1.2 mg/dL   Alkaline Phosphatase 67 44 - 121 IU/L   AST 21 0 - 40 IU/L   ALT 17 0 - 32 IU/L  CBC with Differential/Platelet  Result Value Ref Range   WBC 7.8 3.4 - 10.8 x10E3/uL   RBC 4.48 3.77 - 5.28 x10E6/uL   Hemoglobin 12.1 11.1 - 15.9 g/dL   Hematocrit 37.0 34.0 - 46.6 %   MCV 83 79 - 97 fL   MCH 27.0 26.6 -  33.0 pg   MCHC 32.7 31.5 - 35.7 g/dL   RDW 15.1 11.7 - 15.4 %   Platelets 457 (H) 150 - 450 x10E3/uL   Neutrophils 61 Not Estab. %   Lymphs 31 Not Estab. %   Monocytes 6 Not Estab. %   Eos 1 Not Estab. %   Basos 1 Not Estab. %   Neutrophils Absolute 4.8 1.4 - 7.0 x10E3/uL   Lymphocytes Absolute 2.4 0.7 - 3.1 x10E3/uL   Monocytes Absolute 0.4 0.1 - 0.9 x10E3/uL   EOS (ABSOLUTE) 0.1 0.0 - 0.4 x10E3/uL   Basophils Absolute 0.0 0.0 - 0.2 x10E3/uL   Immature Granulocytes 0 Not Estab. %   Immature Grans (Abs) 0.0 0.0 - 0.1 x10E3/uL  Hepatitis C antibody  Result Value Ref Range   Hep C Virus Ab <0.1 0.0 - 0.9 s/co ratio    Assessment & Plan    Routine Health Maintenance and Physical Exam  Exercise Activities and Dietary recommendations Goals      Patient Stated   .  "I would like to see a therapist to help with my depression completed (pt-stated)      [place] (see longitudinal plan of care for additional care plan information)  Current Barriers:  Marland Kitchen Mental Health Concerns   Clinical Social Work Clinical Goal(s):  Marland Kitchen Over the next 90 days, patient will follow up with the mental health agency discussed for ongoing mental health therapy as directed by SW  Interventions: . Confirmed that  patient has started counseling with  [facility] (mental health provider) for long term follow up and therapy/counseling as of last week . Confirmed that she will continue to go recognizing that the process may take some adjusting  . Discussed plans with patient for ongoing care management follow up and provided patient with direct contact information for care management team in the future if needed  Patient Self Care Activities:  . Performs ADL's independently . Performs IADL's independently . Knowledge deficit regarding local mental health agencies  Please see past updates related to this goal by clicking on the "Past Updates" button in the selected goal          Immunization History  Administered Date(s) Administered  . Hepatitis B, ped/adol 03/31/1997, 05/04/1997, 08/19/1997  . Influenza-Unspecified 01/30/2010, 02/12/2014, 02/13/2015  . MMR 01/08/2013  . Pneumococcal Polysaccharide-23 02/13/2015  . Td 03/31/1997  . Tdap 10/20/2007, 11/04/2019    Health Maintenance  Topic Date Due  . COVID-19 Vaccine (1) 07/29/2020 (Originally 09[birth year]1988)  . INFLUENZA VACCINE  08/29/2020 (Originally 11/28/2019)  . PAP SMEAR-Modifier  05/20/2022  .  TETANUS/TDAP  11/03/2029  . Hepatitis C Screening  Completed  . HIV Screening  Completed  . HPV VACCINES  Aged Out    Discussed health benefits of physical activity, and encouraged her to engage in regular exercise appropriate for her age and condition.  1. Annual physical exam   2. Tubal ligation status  Can consider birth control on top of tubal ligation if she remains concerned.   3. Encounter for hepatitis C screening test for low risk patient  - Hepatitis C antibody  4. Encounter for screening for HIV  - HIV Antibody (routine testing w rflx)  5. Routine physical examination  - TSH - Lipid panel - Comprehensive metabolic panel - CBC with Differential/Platelet  6. Depression, major, single episode, mild ([place])  Continue buspar.   7. Paroxysmal tachycardia ([place])   8. Anxiety  - busPIRone (BUSPAR) 7.5 MG tablet; Take 1 tablet (7.5 mg total) by mouth 2 (two) times daily.  Dispense: 90 tablet; Refill: 1  9. Depression, unspecified depression type  - busPIRone (BUSPAR) 7.5 MG tablet; Take 1 tablet (7.5 mg total) by mouth 2 (two) times daily.  Dispense: 90 tablet; Refill: 1  10. Mild intermittent asthma without complication  - Fluticasone-Salmeterol (WIXELA INHUB) 100-50 MCG/DOSE AEPB; INHALE 1 PUFF INTO THE LUNGS TWICE DAILY  Dispense: 60 each; Refill: 1 - albuterol (VENTOLIN HFA) 108 (90 Base) MCG/ACT inhaler; Inhale 2 puffs into the lungs every 6 (six) hours as needed for wheezing or  shortness of breath.  Dispense: 1 each; Refill: 2 - albuterol (PROVENTIL) (2.5 MG/3ML) 0.083% nebulizer solution; Take 3 mLs (2.5 mg total) by nebulization every 6 (six) hours as needed for wheezing or shortness of breath.  Dispense: 150 mL; Refill: 1   Return in about 1 year (around 07/13/2021) for cpe chronic .     ITrinna Post, PA-C, have reviewed all documentation for this visit. The documentation on 07/14/20 for the exam, diagnosis, procedures, and orders are all accurate and complete.  The entirety of the information documented in the History of Present Illness, Review of Systems and Physical Exam were personally obtained by me. Portions of this information were initially documented by [facility] and reviewed by me for thoroughness and accuracy.     Paulene Floor  [facility] [phone] (phone) [phone] (fax)  [place]

## 2020-07-13 NOTE — Patient Instructions (Signed)
Health Maintenance, Female Adopting a healthy lifestyle and getting preventive care are important in promoting health and wellness. Ask your health care provider about:  The right schedule for you to have regular tests and exams.  Things you can do on your own to prevent diseases and keep yourself healthy. What should I know about diet, weight, and exercise? Eat a healthy diet  Eat a diet that includes plenty of vegetables, fruits, low-fat dairy products, and lean protein.  Do not eat a lot of foods that are high in solid fats, added sugars, or sodium.   Maintain a healthy weight Body mass index (BMI) is used to identify weight problems. It estimates body fat based on height and weight. Your health care provider can help determine your BMI and help you achieve or maintain a healthy weight. Get regular exercise Get regular exercise. This is one of the most important things you can do for your health. Most adults should:  Exercise for at least 150 minutes each week. The exercise should increase your heart rate and make you sweat (moderate-intensity exercise).  Do strengthening exercises at least twice a week. This is in addition to the moderate-intensity exercise.  Spend less time sitting. Even light physical activity can be beneficial. Watch cholesterol and blood lipids Have your blood tested for lipids and cholesterol at 37 years of age, then have this test every 5 years. Have your cholesterol levels checked more often if:  Your lipid or cholesterol levels are high.  You are older than 37 years of age.  You are at high risk for heart disease. What should I know about cancer screening? Depending on your health history and family history, you may need to have cancer screening at various ages. This may include screening for:  Breast cancer.  Cervical cancer.  Colorectal cancer.  Skin cancer.  Lung cancer. What should I know about heart disease, diabetes, and high blood  pressure? Blood pressure and heart disease  High blood pressure causes heart disease and increases the risk of stroke. This is more likely to develop in people who have high blood pressure readings, are of African descent, or are overweight.  Have your blood pressure checked: ? Every 3-5 years if you are 18-39 years of age. ? Every year if you are 40 years old or older. Diabetes Have regular diabetes screenings. This checks your fasting blood sugar level. Have the screening done:  Once every three years after age 40 if you are at a normal weight and have a low risk for diabetes.  More often and at a younger age if you are overweight or have a high risk for diabetes. What should I know about preventing infection? Hepatitis B If you have a higher risk for hepatitis B, you should be screened for this virus. Talk with your health care provider to find out if you are at risk for hepatitis B infection. Hepatitis C Testing is recommended for:  Everyone born from 1945 through 1965.  Anyone with known risk factors for hepatitis C. Sexually transmitted infections (STIs)  Get screened for STIs, including gonorrhea and chlamydia, if: ? You are sexually active and are younger than 37 years of age. ? You are older than 37 years of age and your health care provider tells you that you are at risk for this type of infection. ? Your sexual activity has changed since you were last screened, and you are at increased risk for chlamydia or gonorrhea. Ask your health care provider   if you are at risk.  Ask your health care provider about whether you are at high risk for HIV. Your health care provider may recommend a prescription medicine to help prevent HIV infection. If you choose to take medicine to prevent HIV, you should first get tested for HIV. You should then be tested every 3 months for as long as you are taking the medicine. Pregnancy  If you are about to stop having your period (premenopausal) and  you may become pregnant, seek counseling before you get pregnant.  Take 400 to 800 micrograms (mcg) of folic acid every day if you become pregnant.  Ask for birth control (contraception) if you want to prevent pregnancy. Osteoporosis and menopause Osteoporosis is a disease in which the bones lose minerals and strength with aging. This can result in bone fractures. If you are 65 years old or older, or if you are at risk for osteoporosis and fractures, ask your health care provider if you should:  Be screened for bone loss.  Take a calcium or vitamin D supplement to lower your risk of fractures.  Be given hormone replacement therapy (HRT) to treat symptoms of menopause. Follow these instructions at home: Lifestyle  Do not use any products that contain nicotine or tobacco, such as cigarettes, e-cigarettes, and chewing tobacco. If you need help quitting, ask your health care provider.  Do not use street drugs.  Do not share needles.  Ask your health care provider for help if you need support or information about quitting drugs. Alcohol use  Do not drink alcohol if: ? Your health care provider tells you not to drink. ? You are pregnant, may be pregnant, or are planning to become pregnant.  If you drink alcohol: ? Limit how much you use to 0-1 drink a day. ? Limit intake if you are breastfeeding.  Be aware of how much alcohol is in your drink. In the [place] one drink equals one 12 oz bottle of beer (355 mL), one 5 oz glass of wine (148 mL), or one 1 oz glass of hard liquor (44 mL). General instructions  Schedule regular health, dental, and eye exams.  Stay current with your vaccines.  Tell your health care provider if: ? You often feel depressed. ? You have ever been abused or do not feel safe at home. Summary  Adopting a healthy lifestyle and getting preventive care are important in promoting health and wellness.  Follow your health care provider's instructions about healthy  diet, exercising, and getting tested or screened for diseases.  Follow your health care provider's instructions on monitoring your cholesterol and blood pressure. This information is not intended to replace advice given to you by your health care provider. Make sure you discuss any questions you have with your health care provider. Document Revised: 04/08/2018 Document Reviewed: 04/08/2018 Elsevier Patient Education  2021 Elsevier Inc.  

## 2020-07-14 LAB — CBC WITH DIFFERENTIAL/PLATELET
Basophils Absolute: 0 10*3/uL (ref 0.0–0.2)
Basos: 1 %
EOS (ABSOLUTE): 0.1 10*3/uL (ref 0.0–0.4)
Eos: 1 %
Hematocrit: 37 % (ref 34.0–46.6)
Hemoglobin: 12.1 g/dL (ref 11.1–15.9)
Immature Grans (Abs): 0 10*3/uL (ref 0.0–0.1)
Immature Granulocytes: 0 %
Lymphocytes Absolute: 2.4 10*3/uL (ref 0.7–3.1)
Lymphs: 31 %
MCH: 27 pg (ref 26.6–33.0)
MCHC: 32.7 g/dL (ref 31.5–35.7)
MCV: 83 fL (ref 79–97)
Monocytes Absolute: 0.4 10*3/uL (ref 0.1–0.9)
Monocytes: 6 %
Neutrophils Absolute: 4.8 10*3/uL (ref 1.4–7.0)
Neutrophils: 61 %
Platelets: 457 10*3/uL — ABNORMAL HIGH (ref 150–450)
RBC: 4.48 x10E6/uL (ref 3.77–5.28)
RDW: 15.1 % (ref 11.7–15.4)
WBC: 7.8 10*3/uL (ref 3.4–10.8)

## 2020-07-14 LAB — COMPREHENSIVE METABOLIC PANEL
ALT: 17 IU/L (ref 0–32)
AST: 21 IU/L (ref 0–40)
Albumin/Globulin Ratio: 1.4 (ref 1.2–2.2)
Albumin: 4.4 g/dL (ref 3.8–4.8)
Alkaline Phosphatase: 67 IU/L (ref 44–121)
BUN/Creatinine Ratio: 15 (ref 9–23)
BUN: 18 mg/dL (ref 6–20)
Bilirubin Total: 0.2 mg/dL (ref 0.0–1.2)
CO2: 23 mmol/L (ref 20–29)
Calcium: 9.3 mg/dL (ref 8.7–10.2)
Chloride: 103 mmol/L (ref 96–106)
Creatinine, Ser: 1.22 mg/dL — ABNORMAL HIGH (ref 0.57–1.00)
Globulin, Total: 3.2 g/dL (ref 1.5–4.5)
Glucose: 90 mg/dL (ref 65–99)
Potassium: 4.6 mmol/L (ref 3.5–5.2)
Sodium: 141 mmol/L (ref 134–144)
Total Protein: 7.6 g/dL (ref 6.0–8.5)
eGFR: 59 mL/min/{1.73_m2} — ABNORMAL LOW (ref >59)

## 2020-07-14 LAB — LIPID PANEL
Chol/HDL Ratio: 3.7 ratio (ref 0.0–4.4)
Cholesterol, Total: 230 mg/dL — ABNORMAL HIGH (ref 100–199)
HDL: 63 mg/dL (ref >39)
LDL Chol Calc (NIH): 133 mg/dL — ABNORMAL HIGH (ref 0–99)
Triglycerides: 193 mg/dL — ABNORMAL HIGH (ref 0–149)
VLDL Cholesterol Cal: 34 mg/dL (ref 5–40)

## 2020-07-14 LAB — TSH: TSH: 0.804 u[IU]/mL (ref 0.450–4.500)

## 2020-07-14 LAB — HIV ANTIBODY (ROUTINE TESTING W REFLEX): HIV Screen 4th Generation wRfx: NONREACTIVE

## 2020-07-14 LAB — HEPATITIS C ANTIBODY: Hep C Virus Ab: <0.1 s/co ratio (ref 0.0–0.9)

## 2020-08-10 ENCOUNTER — Other Ambulatory Visit: Payer: Self-pay | Admitting: Physician Assistant

## 2020-08-10 DIAGNOSIS — J4520 Mild intermittent asthma, uncomplicated: Secondary | ICD-10-CM

## 2020-08-10 NOTE — Telephone Encounter (Signed)
  Notes to clinic:  Requesting a 90 day supply   Requested Prescriptions  Pending Prescriptions Disp Refills   WIXELA INHUB 100-50 MCG/DOSE AEPB [Pharmacy Med Name: WIXELA INHUB DISKUS 100/50MCG 60S] 180 each     Sig: INHALE 1 PUFF INTO THE LUNGS TWICE DAILY      Pulmonology:  Combination Products Passed - 08/10/2020  3:38 AM      Passed - Valid encounter within last 12 months    Recent Outpatient Visits           4 weeks ago Annual physical exam   [facility] Jodi Marble, Adriana M, PA-C   5 months ago Mild intermittent asthma without complication   [facility] Osvaldo Angst M, PA-C   9 months ago Anxiety   [facility] Osvaldo Angst M, PA-C   10 months ago Depression, major, single episode, mild [facility])   [facility], [place], [place]   1 year ago History of loop electrosurgical excision procedure (LEEP) of cervix affecting pregnancy, antepartum   [facility] [place], [place], [place]

## 2020-12-17 IMAGING — MG DIGITAL SCREENING BILAT W/ TOMO W/ CAD
8 series · 8 of 24 positions shown · non-contrast
Comparison: None.

CLINICAL DATA: Screening.

EXAM:
DIGITAL SCREENING BILATERAL MAMMOGRAM WITH TOMO AND CAD

[L MLO synth-2D]
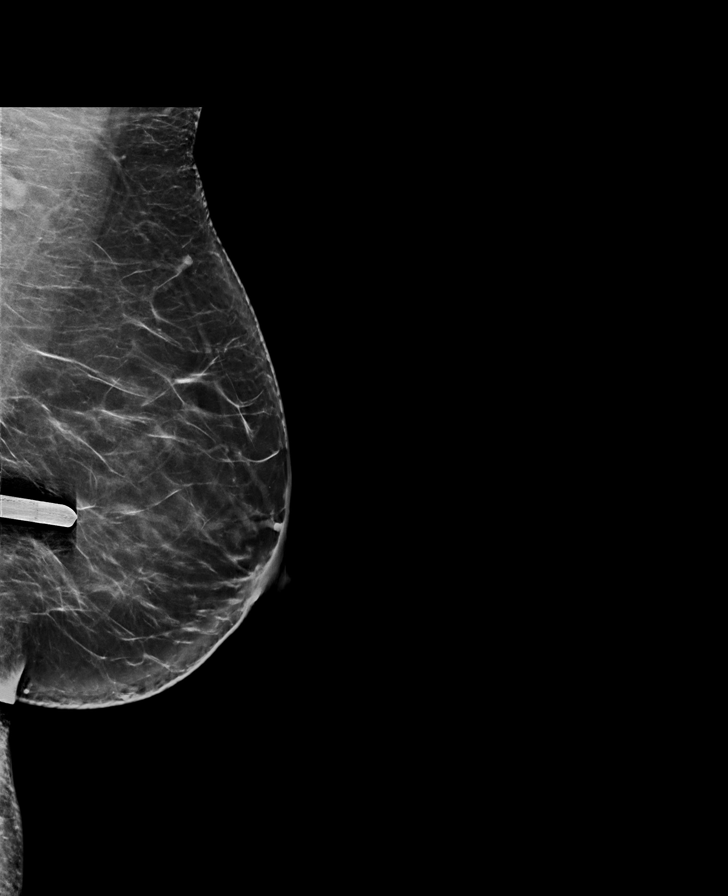

[L CC synth-2D]
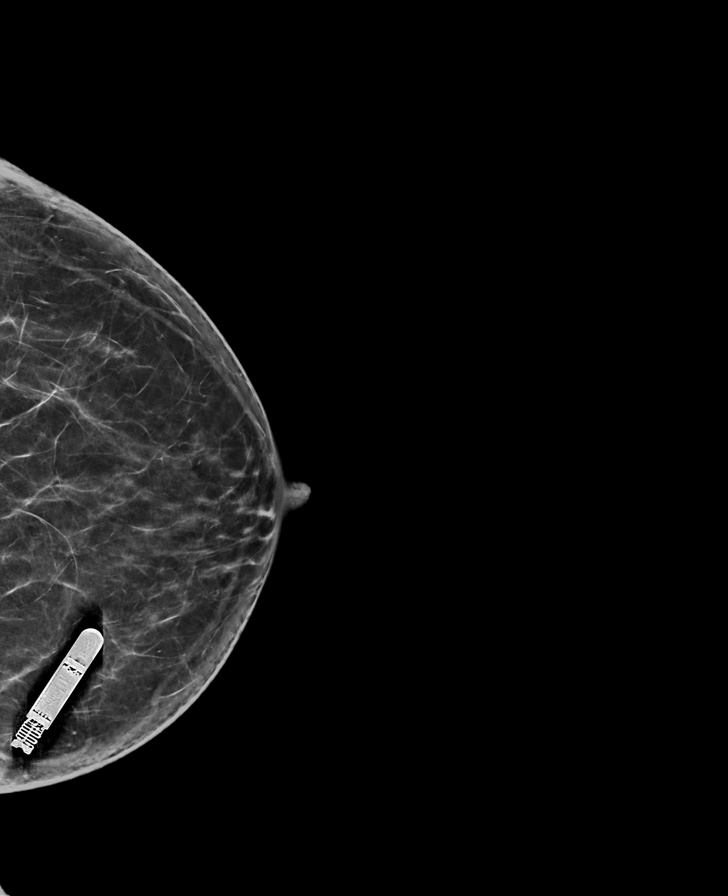

[R MLO synth-2D]
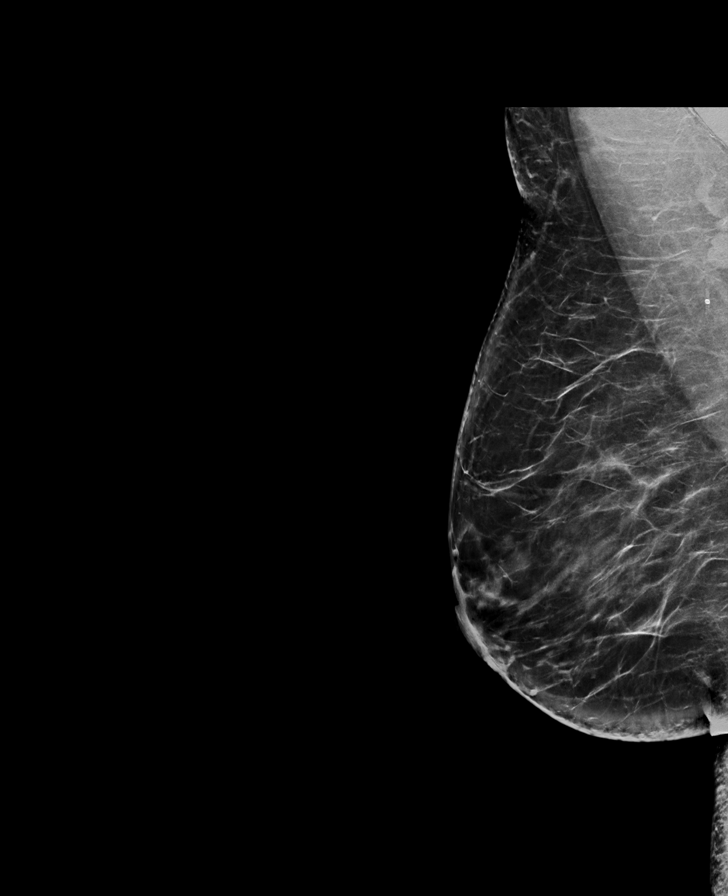

[R CC synth-2D]
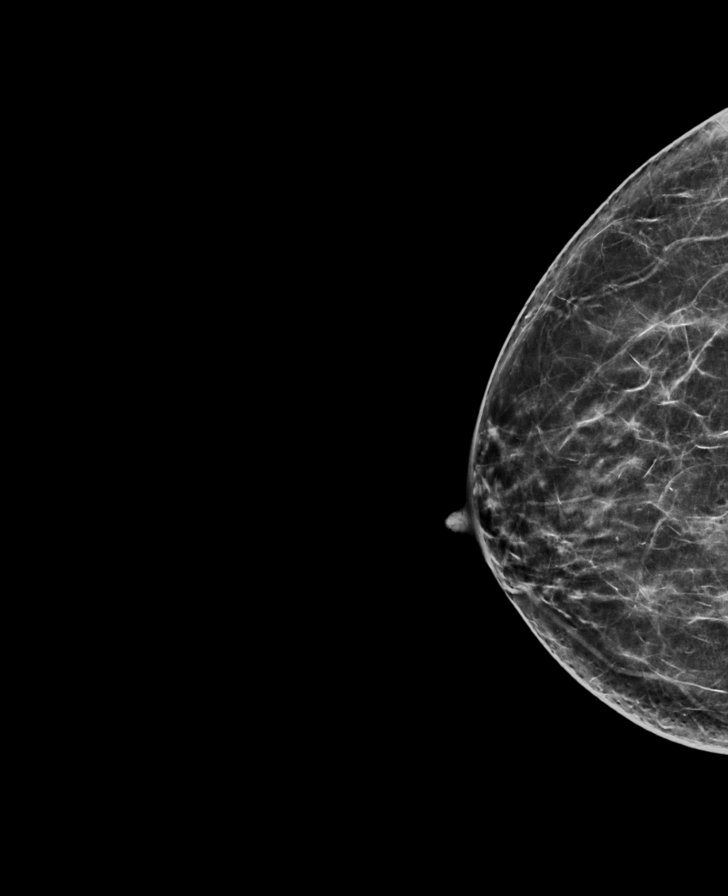

[L CC tomo · tomo slice 35/70.0]
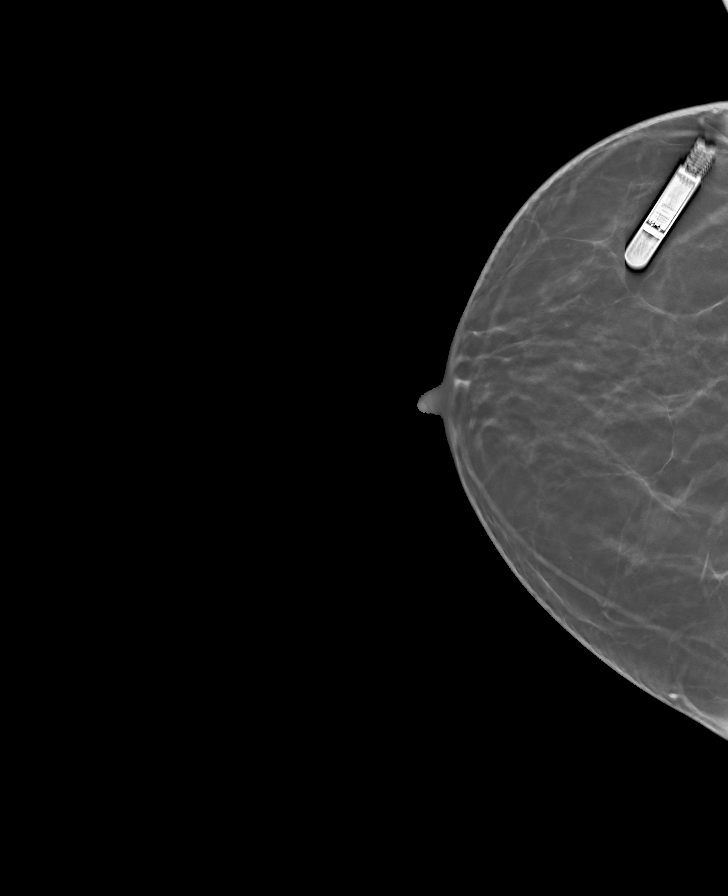

[L MLO tomo · tomo slice 43/86.0]
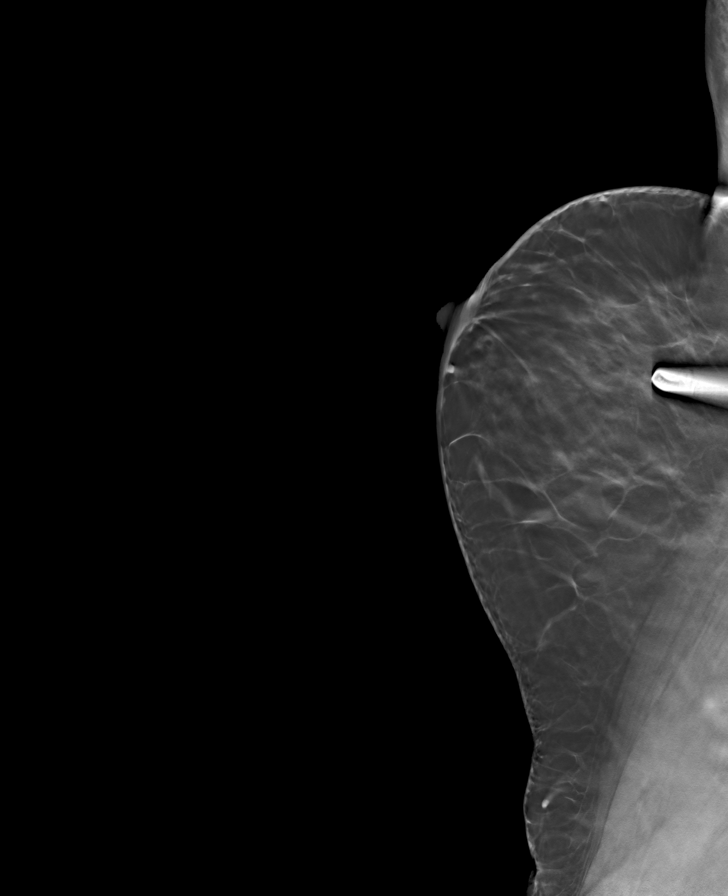

[R MLO tomo · tomo slice 45/89.0]
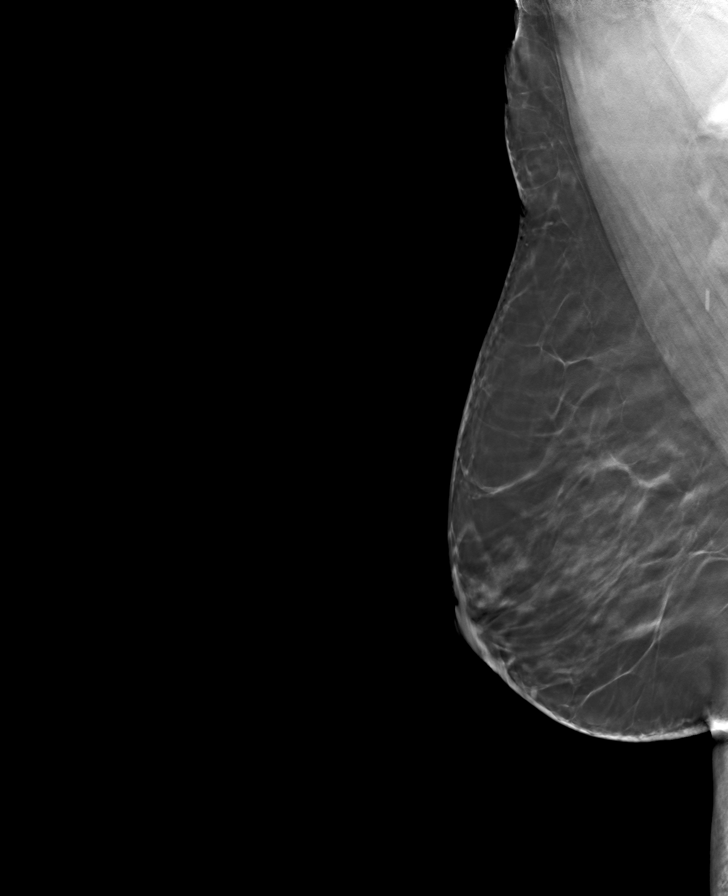

[R CC tomo · tomo slice 33/65.0]
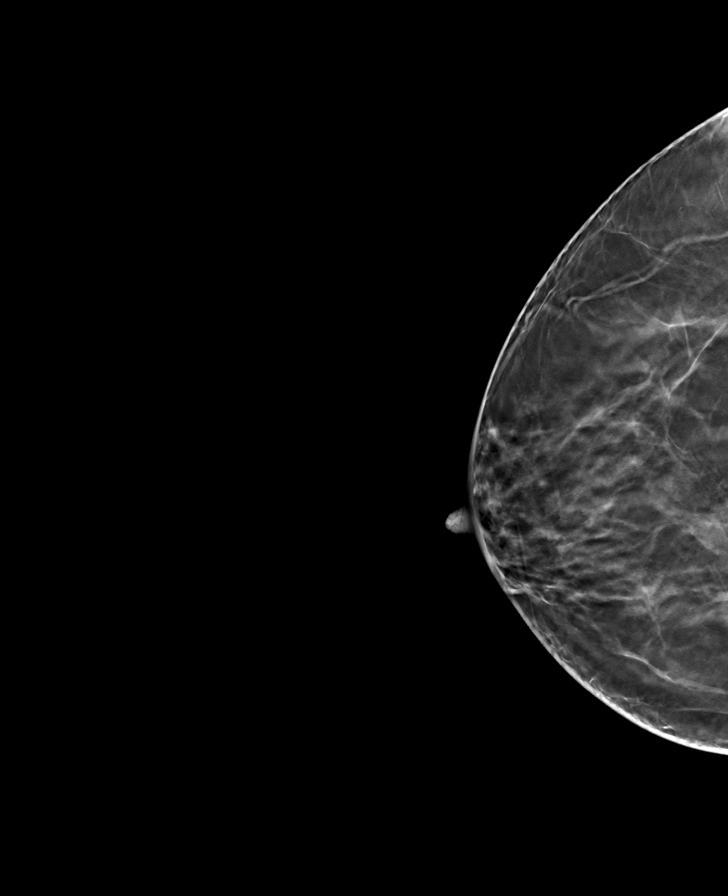

[8 of 24 positions shown; findings below may reference images not displayed]

ACR Breast Density Category b: There are scattered areas of
fibroglandular density.
FINDINGS: There are no findings suspicious for malignancy. Images were
processed with CAD.
IMPRESSION: No mammographic evidence of malignancy. A result letter of this
screening mammogram will be mailed directly to the patient.

RECOMMENDATION:
Screening mammogram at age 40. (Code:[id number]-R-FEN)

BI-RADS CATEGORY  1: Negative.

## 2021-02-28 ENCOUNTER — Ambulatory Visit: Payer: No Typology Code available for payment source | Admitting: Obstetrics and Gynecology

## 2021-03-02 ENCOUNTER — Ambulatory Visit: Payer: No Typology Code available for payment source | Admitting: Obstetrics and Gynecology

## 2021-03-05 ENCOUNTER — Other Ambulatory Visit: Payer: Self-pay

## 2021-03-05 ENCOUNTER — Encounter: Payer: Self-pay | Admitting: Obstetrics and Gynecology

## 2021-03-05 ENCOUNTER — Ambulatory Visit (INDEPENDENT_AMBULATORY_CARE_PROVIDER_SITE_OTHER): Payer: No Typology Code available for payment source | Admitting: Obstetrics and Gynecology

## 2021-03-05 VITALS — BP 130/72 | Ht 63.0 in | Wt 192.6 lb

## 2021-03-05 DIAGNOSIS — B9689 Other specified bacterial agents as the cause of diseases classified elsewhere: Secondary | ICD-10-CM | POA: Diagnosis not present

## 2021-03-05 DIAGNOSIS — R875 Abnormal microbiological findings in specimens from female genital organs: Secondary | ICD-10-CM | POA: Diagnosis not present

## 2021-03-05 DIAGNOSIS — E282 Polycystic ovarian syndrome: Secondary | ICD-10-CM | POA: Diagnosis not present

## 2021-03-05 DIAGNOSIS — N760 Acute vaginitis: Secondary | ICD-10-CM

## 2021-03-05 MED ORDER — MEDROXYPROGESTERONE ACETATE 10 MG PO TABS: 10.0000 mg | ORAL_TABLET | Freq: Every day | ORAL | 4 refills | Status: AC

## 2021-03-05 MED ORDER — METRONIDAZOLE 500 MG PO TABS: 500.0000 mg | ORAL_TABLET | Freq: Two times a day (BID) | ORAL | 0 refills | Status: AC

## 2021-03-05 NOTE — Patient Instructions (Signed)
Exercising to Stay Healthy To become healthy and stay healthy, it is recommended that you do moderate-intensity and vigorous-intensity exercise. You can tell that you are exercising at a moderate intensity if your heart starts beating faster and you start breathing faster but can still hold a conversation. You can tell that you are exercising at a vigorous intensity if you are breathing much harder and faster and cannot hold a conversation while exercising. How can exercise benefit me? Exercising regularly is important. It has many health benefits, such as: Improving overall fitness, flexibility, and endurance. Increasing bone density. Helping with weight control. Decreasing body fat. Increasing muscle strength and endurance. Reducing stress and tension, anxiety, depression, or anger. Improving overall health. What guidelines should I follow while exercising? Before you start a new exercise program, talk with your health care provider. Do not exercise so much that you hurt yourself, feel dizzy, or get very short of breath. Wear comfortable clothes and wear shoes with good support. Drink plenty of water while you exercise to prevent dehydration or heat stroke. Work out until your breathing and your heartbeat get faster (moderate intensity). How often should I exercise? Choose an activity that you enjoy, and set realistic goals. Your health care provider can help you make an activity plan that is individually designed and works best for you. Exercise regularly as told by your health care provider. This may include: Doing strength training two times a week, such as: Lifting weights. Using resistance bands. Push-ups. Sit-ups. Yoga. Doing a certain intensity of exercise for a given amount of time. Choose from these options: A total of 150 minutes of moderate-intensity exercise every week. A total of 75 minutes of vigorous-intensity exercise every week. A mix of moderate-intensity and  vigorous-intensity exercise every week. Children, pregnant women, people who have not exercised regularly, people who are overweight, and older adults may need to talk with a health care provider about what activities are safe to perform. If you have a medical condition, be sure to talk with your health care provider before you start a new exercise program. What are some exercise ideas? Moderate-intensity exercise ideas include: Walking 1 mile (1.6 km) in about 15 minutes. Biking. Hiking. Golfing. Dancing. Water aerobics. Vigorous-intensity exercise ideas include: Walking 4.5 miles (7.2 km) or more in about 1 hour. Jogging or running 5 miles (8 km) in about 1 hour. Biking 10 miles (16.1 km) or more in about 1 hour. Lap swimming. Roller-skating or in-line skating. Cross-country skiing. Vigorous competitive sports, such as football, basketball, and soccer. Jumping rope. Aerobic dancing. What are some everyday activities that can help me get exercise? Yard work, such as: Pushing a lawn mower. Raking and bagging leaves. Washing your car. Pushing a stroller. Shoveling snow. Gardening. Washing windows or floors. How can I be more active in my day-to-day activities? Use stairs instead of an elevator. Take a walk during your lunch break. If you drive, park your car farther away from your work or school. If you take public transportation, get off one stop early and walk the rest of the way. Stand up or walk around during all of your indoor phone calls. Get up, stretch, and walk around every 30 minutes throughout the day. Enjoy exercise with a friend. Support to continue exercising will help you keep a regular routine of activity. Where to find more information You can find more information about exercising to stay healthy from: U.S. Department of Health and Human Services: www.hhs.gov Centers for Disease Control and Prevention (  CDC): FootballExhibition.com.br Summary Exercising regularly is  important. It will improve your overall fitness, flexibility, and endurance. Regular exercise will also improve your overall health. It can help you control your weight, reduce stress, and improve your bone density. Do not exercise so much that you hurt yourself, feel dizzy, or get very short of breath. Before you start a new exercise program, talk with your health care provider. This information is not intended to replace advice given to you by your health care provider. Make sure you discuss any questions you have with your health care provider. Document Revised: 08/11/2020 Document Reviewed: 08/11/2020 Elsevier Patient Education  2022 Elsevier Inc. Budget-Friendly Healthy Eating There are many ways to save money at the grocery store and continue to eat healthy. You can be successful if you: Plan meals according to your budget. Make a grocery list and only purchase food according to your grocery list. Prepare food yourself at home. What are tips for following this plan? Reading food labels Compare food labels between brand name foods and the store brand. Often the nutritional value is the same, but the store brand is lower cost. Look for products that do not have added sugar, fat, or salt (sodium). These often cost the same but are healthier for you. Products may be labeled as: Sugar-free. Nonfat. Low-fat. Sodium-free. Low-sodium. Look for lean ground beef labeled as at least 92% lean and 8% fat. Shopping  Buy only the items on your grocery list and go only to the areas of the store that have the items on your list. Use coupons only for foods and brands you normally buy. Avoid buying items you wouldn't normally buy simply because they are on sale. Check online and in newspapers for weekly deals. Buy healthy items from the bulk bins when available, such as herbs, spices, flour, pasta, nuts, and dried fruit. Buy fruits and vegetables that are in season. Prices are usually lower on  in-season produce. Look at the unit price on the price tag. Use it to compare different brands and sizes to find out which item is the best deal. Choose healthy items that are often low-cost, such as carrots, potatoes, apples, bananas, and oranges. Dried or canned beans are a low-cost protein source. Buy in bulk and freeze extra food. Items you can buy in bulk include meats, fish, poultry, frozen fruits, and frozen vegetables. Avoid buying "ready-to-eat" foods, such as pre-cut fruits and vegetables and pre-made salads. If possible, shop around to discover where you can find the best prices. Consider other retailers such as dollar stores, larger [employer], local fruit and vegetable stands, and farmers markets. Do not shop when you are hungry. If you shop while hungry, it may be hard to stick to your list and budget. Resist impulse buying. Use your grocery list as your official plan for the week. Buy a variety of vegetables and fruits by purchasing fresh, frozen, and canned items. Look at the top and bottom shelves for deals. Foods at eye level (eye level of an adult or child) are usually more expensive. Be efficient with your time when shopping. The more time you spend at the store, the more money you are likely to spend. To save money when choosing more expensive foods like meats and dairy: Choose cheaper cuts of meat, such as bone-in chicken thighs and drumsticks instead of skinless and boneless chicken. When you are ready to prepare the chicken, you can remove the skin yourself to make it healthier. Choose lean meats like  chicken or [place] instead of beef. Choose canned seafood, such as tuna, salmon, or sardines. Buy eggs as a low-cost source of protein. Buy dried beans and peas, such as lentils, split peas, or kidney beans instead of meats. Dried beans and peas are a good alternative source of protein. Buy the larger tubs of yogurt instead of individual-sized containers. Choose water  instead of sodas and other sweetened beverages. Avoid buying chips, cookies, and other "junk food." These items are usually expensive and not healthy. Cooking Make extra food and freeze the extras in meal-sized containers or in individual portions for fast meals and snacks. Pre-cook on days when you have extra time to prepare meals in advance. You can keep these meals in the fridge or freezer and reheat for a quick meal. When you come home from the grocery store, wash, peel, and cut fruits and vegetables so they are ready to use and eat. This will help reduce food waste. Meal planning Do not eat out or get fast food. Prepare food at home. Make a grocery list and make sure to bring it with you to the store. If you have a smart phone, you could use your phone to create your shopping list. Plan meals and snacks according to a grocery list and budget you create. Use leftovers in your meal plan for the week. Look for recipes where you can cook once and make enough food for two meals. Prepare budget-friendly types of meals like stews, casseroles, and stir-fry dishes. Try some meatless meals or try "no cook" meals like salads. Make sure that half your plate is filled with fruits or vegetables. Choose from fresh, frozen, or canned fruits and vegetables. If eating canned, remember to rinse them before eating. This will remove any excess salt added for packaging. Summary Eating healthy on a budget is possible if you plan your meals according to your budget, purchase according to your budget and grocery list, and prepare food yourself. Tips for buying more food on a limited budget include buying generic brands, using coupons only for foods you normally buy, and buying healthy items from the bulk bins when available. Tips for buying cheaper food to replace expensive food include choosing cheaper, lean cuts of meat, and buying dried beans and peas. This information is not intended to replace advice given to  you by your health care provider. Make sure you discuss any questions you have with your health care provider. Document Revised: 01/27/2020 Document Reviewed: 01/27/2020 Elsevier Patient Education  2022 [employer].    Some resources about healthy lifestyle:  Physical activity http://kirby-bean.org/ Maintaining a Healthy Weight CoSpecials.tn Heart  Disease https://gomez.info/ Diabetes Prevention ThemeManagement.no

## 2021-03-05 NOTE — Progress Notes (Signed)
Patient ID: Desiree Collins, female   DOB: 1985-06-14, 35 y.o.   MRN: [id number]  Reason for Consult: Gynecologic Exam   Referred by No ref. provider found  Subjective:     HPI:  Desiree Collins is a 35 y.o. female. She is here today with complaint of abnormal spotting and bleeding for the last 2 months. Her menstrual cycles seems to be prolonged with 7  days of spotting. She is having heavy bleeding.   Gynecological History  Patient's last menstrual period was 01/14/2021. Menarche:12  History of fibroids, polyps, or ovarian cysts? : no  History of PCOS? yes Hstory of Endometriosis? no History of abnormal pap smears? yes Have you had any sexually transmitted infections in the past? Not answered  She denies HPV vaccination in the past.   She identifies as a female. She is sexually active with men.   She denies dyspareunia. She denies postcoital bleeding.  She currently uses tubal ligation for contraception.   Past Medical History:  Diagnosis Date   Asthma    BRCA negative 05/2019   MyRisk neg except AXIN2, BMPR1A, CHEK2, MSH3, POLE VUS   Family history of breast cancer    Family history of ovarian cancer    Increased risk of breast cancer 05/2019   IBIS=23.7%   Family History  Problem Relation Age of Onset   Diabetes Mother    Breast cancer Mother 73       again at 4   Ovarian cancer Mother 67   Hypertension Father    Breast cancer Maternal Grandmother 66       again at 14   Cervical cancer Maternal Grandmother    Bone cancer Maternal Grandfather    Colon cancer Maternal Grandfather 63   Breast cancer Maternal Aunt 52   Breast cancer Paternal Grandmother    Breast cancer Cousin    Breast cancer Cousin    Past Surgical History:  Procedure Laterality Date   LEEP     TUBAL LIGATION      Short Social History:  Social History   Tobacco Use   Smoking status: Never   Smokeless tobacco: Never  Substance Use Topics   Alcohol use: Yes    Allergies   Allergen Reactions   Chocolate Hives   Morphine Hives    Current Outpatient Medications  Medication Sig Dispense Refill   albuterol (PROVENTIL) (2.5 MG/3ML) 0.083% nebulizer solution Take 3 mLs (2.5 mg total) by nebulization every 6 (six) hours as needed for wheezing or shortness of breath. 150 mL 1   albuterol (VENTOLIN HFA) 108 (90 Base) MCG/ACT inhaler Inhale 2 puffs into the lungs every 6 (six) hours as needed for wheezing or shortness of breath. 1 each 2   busPIRone (BUSPAR) 7.5 MG tablet Take 1 tablet (7.5 mg total) by mouth 2 (two) times daily. 90 tablet 1   medroxyPROGESTERone (PROVERA) 10 MG tablet Take 1 tablet (10 mg total) by mouth daily. Use once a month for 10 consecutive days for menstrual cycle regulation 30 tablet 4   metroNIDAZOLE (FLAGYL) 500 MG tablet Take 1 tablet (500 mg total) by mouth 2 (two) times daily. 14 tablet 0   pantoprazole (PROTONIX) 40 MG tablet TAKE 1 TABLET(40 MG) BY MOUTH DAILY 90 tablet 0   propranolol (INDERAL) 20 MG tablet TAKE 1 TABLET(20 MG) BY MOUTH TWICE DAILY AS NEEDED FOR PALPITATIONS 60 tablet 2   WIXELA INHUB 100-50 MCG/DOSE AEPB INHALE 1 PUFF INTO THE LUNGS TWICE DAILY 180 each 0  dicyclomine (BENTYL) 10 MG capsule Take 1 capsule (10 mg total) by mouth 4 (four) times daily for 14 days. 56 capsule 0   ondansetron (ZOFRAN ODT) 4 MG disintegrating tablet Take 1 tablet (4 mg total) by mouth every 8 (eight) hours as needed for nausea or vomiting. (Patient not taking: Reported on 03/05/2021) 20 tablet 0   sucralfate (CARAFATE) 1 g tablet Take 1 tablet (1 g total) by mouth 4 (four) times daily -  with meals and at bedtime. 120 tablet 0   No current facility-administered medications for this visit.    Review of Systems  Constitutional: Negative for chills, fatigue, fever and unexpected weight change.  HENT: Negative for trouble swallowing.  Eyes: Negative for loss of vision.  Respiratory: Negative for cough, shortness of breath and wheezing.   Cardiovascular: Negative for chest pain, leg swelling, palpitations and syncope.  GI: Negative for abdominal pain, blood in stool, diarrhea, nausea and vomiting.  GU: Negative for difficulty urinating, dysuria, frequency and hematuria.  Musculoskeletal: Negative for back pain, leg pain and joint pain.  Skin: Negative for rash.  Neurological: Negative for dizziness, headaches, light-headedness, numbness and seizures.  Psychiatric: Negative for behavioral problem, confusion, depressed mood and sleep disturbance.       Objective:  Objective   Vitals:   03/05/21 0941  BP: 130/72  Weight: 192 lb 9.6 oz (87.4 kg)  Height: 5' 3" (1.6 m)   Body mass index is 34.12 kg/m.  Physical Exam Vitals and nursing note reviewed. Exam conducted with a chaperone present.  Constitutional:      Appearance: Normal appearance.  HENT:     Head: Normocephalic and atraumatic.  Eyes:     Extraocular Movements: Extraocular movements intact.     Pupils: Pupils are equal, round, and reactive to light.  Cardiovascular:     Rate and Rhythm: Normal rate and regular rhythm.  Pulmonary:     Effort: Pulmonary effort is normal.     Breath sounds: Normal breath sounds.  Abdominal:     General: Abdomen is flat.     Palpations: Abdomen is soft.  Musculoskeletal:     Cervical back: Normal range of motion.  Skin:    General: Skin is warm and dry.  Neurological:     General: No focal deficit present.     Mental Status: She is alert and oriented to person, place, and time.  Psychiatric:        Behavior: Behavior normal.        Thought Content: Thought content normal.        Judgment: Judgment normal.    Assessment/Plan:     35 yo with abnormal uterine bleeding Will trial provera for irreuglar bleeding.  Nuswab sent to check for infection Flagyl for suspected BV.  Follow up for pelvic US if no improvement  More than 20 minutes were spent face to face with the patient in the room, reviewing the medical  record, labs and images, and coordinating care for the patient. The plan of management was discussed in detail and counseling was provided.      MD [facility] 03/05/2021 10:43 AM    

## 2021-03-07 LAB — NUSWAB VAGINITIS PLUS (VG+)
Atopobium vaginae: HIGH Score — AB
BVAB 2: HIGH Score — AB
Candida albicans, NAA: NEGATIVE
Candida glabrata, NAA: NEGATIVE
Chlamydia trachomatis, NAA: POSITIVE — AB
Megasphaera 1: HIGH Score — AB
Neisseria gonorrhoeae, NAA: NEGATIVE
Trich vag by NAA: NEGATIVE

## 2021-03-08 ENCOUNTER — Other Ambulatory Visit: Payer: Self-pay | Admitting: Obstetrics and Gynecology

## 2021-03-08 DIAGNOSIS — A749 Chlamydial infection, unspecified: Secondary | ICD-10-CM

## 2021-03-08 MED ORDER — AZITHROMYCIN 500 MG PO TABS: 1000.0000 mg | ORAL_TABLET | Freq: Once | ORAL | 1 refills | Status: AC

## 2021-04-09 IMAGING — CT CT CARDIAC CORONARY ARTERY CALCIUM SCORE
3 series · 14 of 20 positions shown, 16 images · non-contrast
Comparison: None.
COMPARISON: None.

Addendum:
EXAM:
OVER-READ INTERPRETATION  CT CHEST

The following report is an over-read performed by radiologist Dr.
Melynda Billiot [REDACTED] on 11/18/2019. This
over-read does not include interpretation of cardiac or coronary
anatomy or pathology. The coronary calcium score interpretation by
the cardiologist is attached.
CLINICAL DATA: Risk stratification, chestpain. Heart rates to high,
CCTA was rescheduled with appropriate medications for HR recommended
Coronary Calcium Score
TECHNIQUE: The patient was scanned on a Siemens go. Top Scanner. Axial
non-contrast 3 mm slices were carried out through the heart. The
data set was analyzed on a dedicated work station and scored using
the Agatson method.

[Series 7: sa36 calcium scoring 3.00 · axial · 0.35mm/px · z∈[-1054,-994]mm · 4 of 34 slices shown]
[im 7/34  vessel]
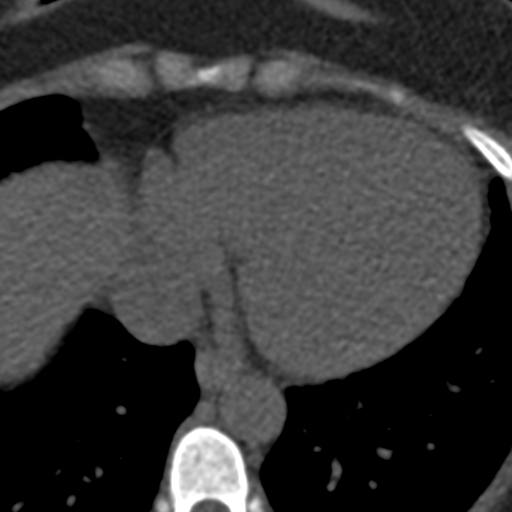
[im 14/34  vessel]
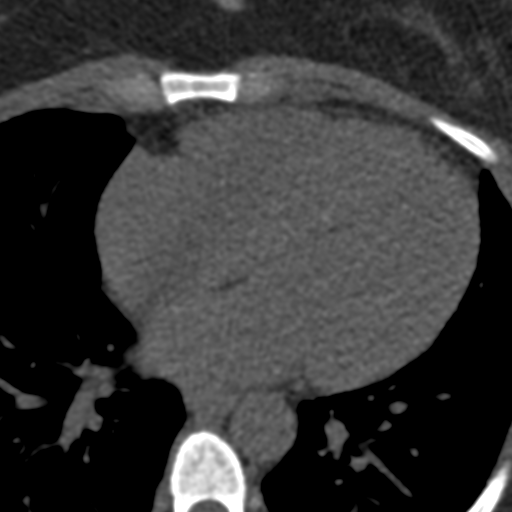
[im 20/34  vessel]
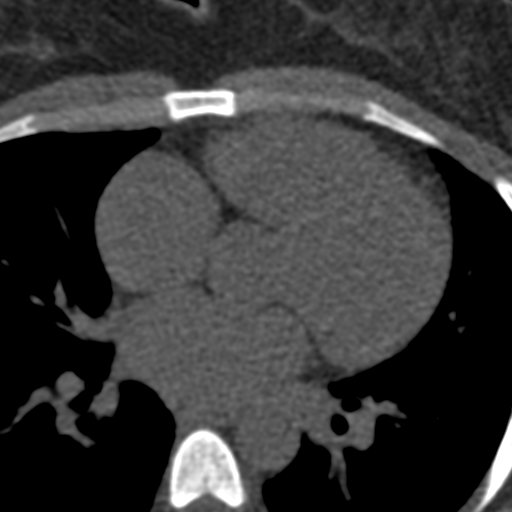
[im 27/34  vessel]
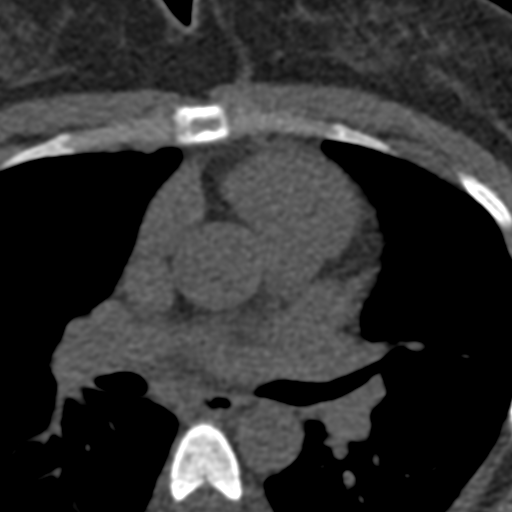

[Series 10: full fov st calcium scoring 3.00 · axial · 0.63mm/px · z∈[-1057,-991]mm · 5 of 34 slices shown, 7 images]
[im 6/34  vessel]
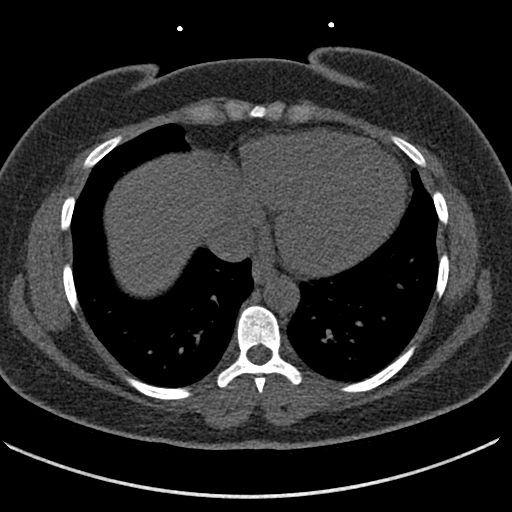
[im 6/34  lung]
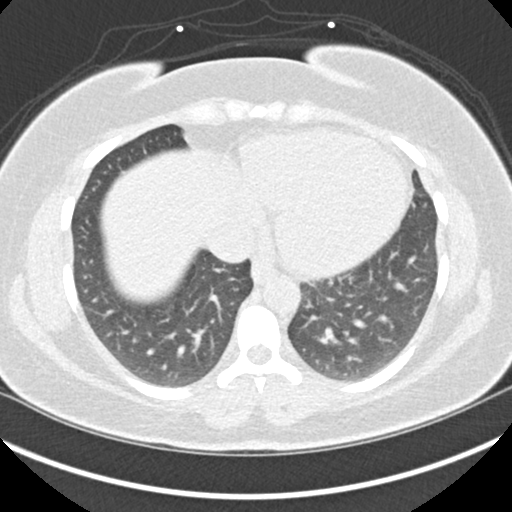
[im 12/34  vessel]
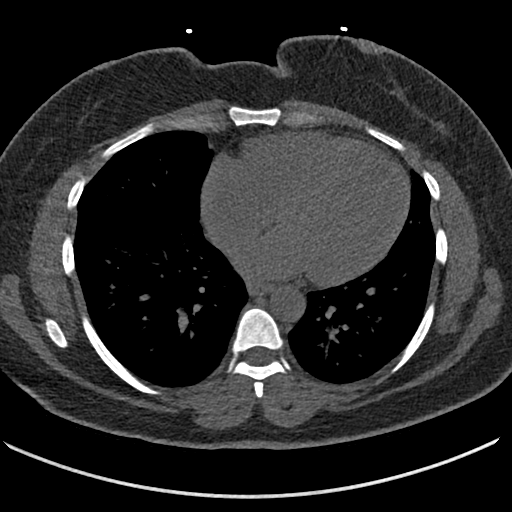
[im 17/34  vessel]
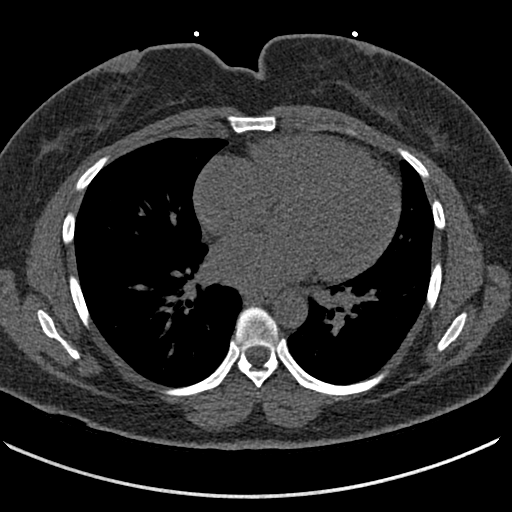
[im 23/34  vessel]
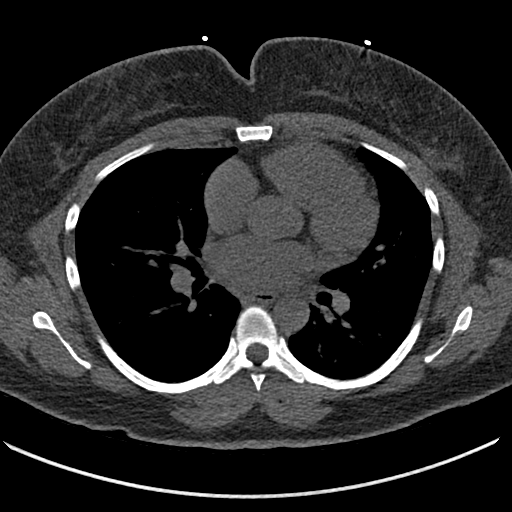
[im 28/34  vessel]
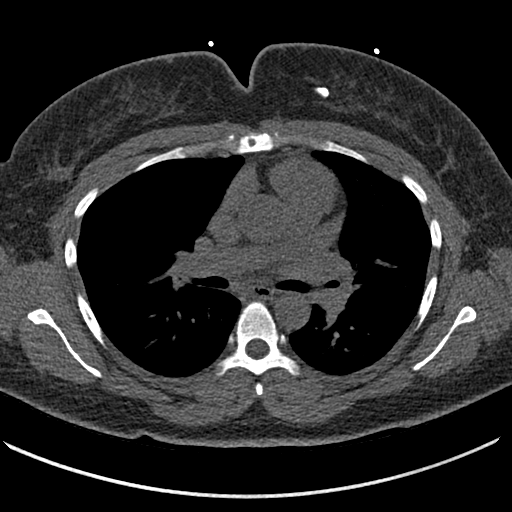
[im 28/34  lung]
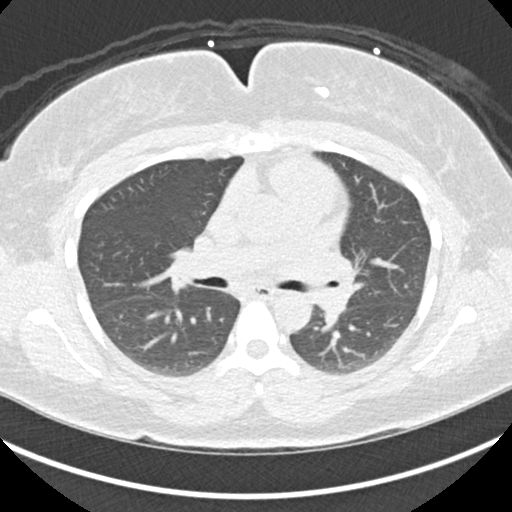

[Series 15: full fov lungs calcium scoring 3.00 ax · axial · 0.63mm/px · z∈[-1057,-991]mm · 5 of 34 slices shown]
[im 6/34  vessel]
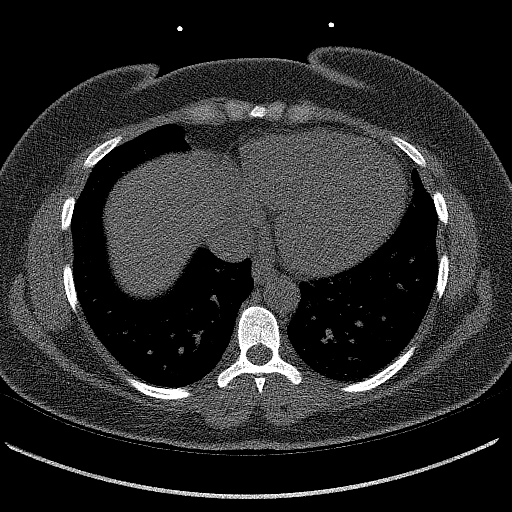
[im 12/34  vessel]
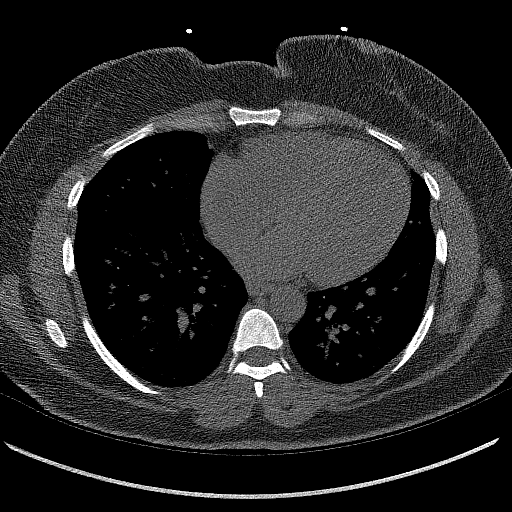
[im 17/34  vessel]
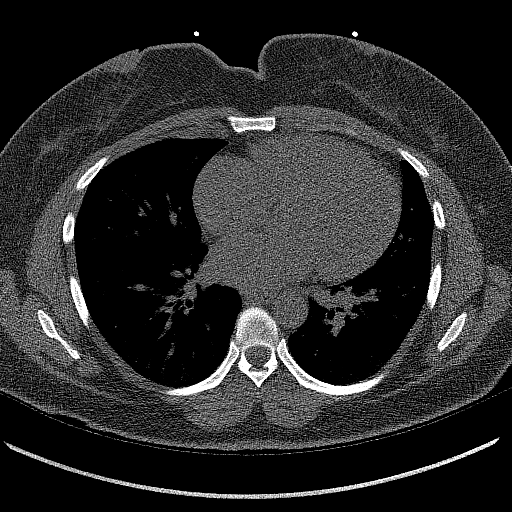
[im 23/34  vessel]
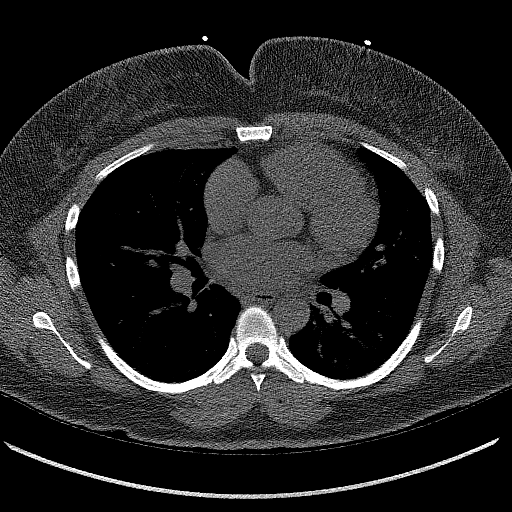
[im 28/34  vessel]
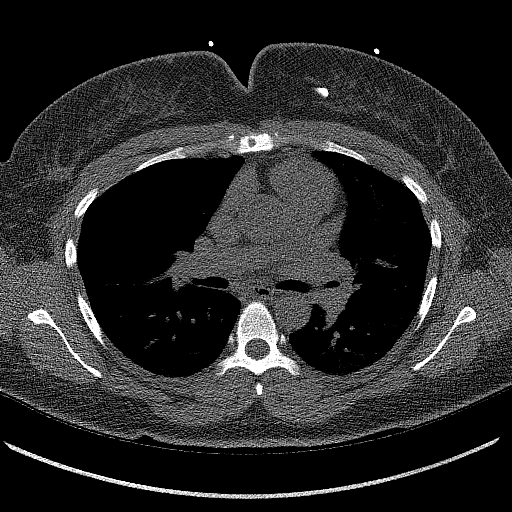

[14 of 20 positions shown; findings below may reference images not displayed]

FINDINGS: Within the visualized portions of the thorax there are no suspicious
appearing pulmonary nodules or masses, there is no acute
consolidative airspace disease, no pleural effusions, no
pneumothorax and no lymphadenopathy. Visualized portions of the
upper abdomen are unremarkable. There are no aggressive appearing
lytic or blastic lesions noted in the visualized portions of the
skeleton. Smaller electronic device in the medial aspect of the left
breast.
IMPRESSION: No significant incidental noncardiac findings are noted.
FINDINGS: Non-cardiac: See separate report from [REDACTED].

Ascending Aorta: Normal size

Pericardium: Normal

Coronary arteries: Normal origin of left and right coronary arteries
IMPRESSION: 1. coronary calcium score of 0. Patient is low risk for near term
coronary events

2. Patient rescheduled for CCTA due to elevated HR. Recommend 10mg
po ivabradine and 100mg metoprolol 1-2hrs prior to follow up scan.

*** End of Addendum ***
EXAM:
OVER-READ INTERPRETATION  CT CHEST

The following report is an over-read performed by radiologist Dr.
Melynda Billiot [REDACTED] on 11/18/2019. This
over-read does not include interpretation of cardiac or coronary
anatomy or pathology. The coronary calcium score interpretation by
the cardiologist is attached.
FINDINGS: Within the visualized portions of the thorax there are no suspicious
appearing pulmonary nodules or masses, there is no acute
consolidative airspace disease, no pleural effusions, no
pneumothorax and no lymphadenopathy. Visualized portions of the
upper abdomen are unremarkable. There are no aggressive appearing
lytic or blastic lesions noted in the visualized portions of the
skeleton. Smaller electronic device in the medial aspect of the left
breast.
IMPRESSION: No significant incidental noncardiac findings are noted.

## 2021-10-16 ENCOUNTER — Other Ambulatory Visit: Payer: Self-pay

## 2021-10-16 ENCOUNTER — Emergency Department
Admission: EM | Admit: 2021-10-16 | Discharge: 2021-10-16 | Disposition: A | Discharge status: Home / Self Care | Payer: Medicaid Other | Attending: Emergency Medicine | Admitting: Emergency Medicine

## 2021-10-16 ENCOUNTER — Emergency Department: Payer: Medicaid Other

## 2021-10-16 ENCOUNTER — Encounter: Payer: Self-pay | Admitting: *Deleted

## 2021-10-16 DIAGNOSIS — J45909 Unspecified asthma, uncomplicated: Secondary | ICD-10-CM | POA: Insufficient documentation

## 2021-10-16 DIAGNOSIS — R509 Fever, unspecified: Secondary | ICD-10-CM | POA: Diagnosis present

## 2021-10-16 DIAGNOSIS — J189 Pneumonia, unspecified organism: Secondary | ICD-10-CM

## 2021-10-16 DIAGNOSIS — J181 Lobar pneumonia, unspecified organism: Secondary | ICD-10-CM | POA: Insufficient documentation

## 2021-10-16 DIAGNOSIS — Z20822 Contact with and (suspected) exposure to covid-19: Secondary | ICD-10-CM | POA: Diagnosis not present

## 2021-10-16 DIAGNOSIS — R000 Tachycardia, unspecified: Secondary | ICD-10-CM | POA: Insufficient documentation

## 2021-10-16 DIAGNOSIS — J029 Acute pharyngitis, unspecified: Secondary | ICD-10-CM | POA: Diagnosis not present

## 2021-10-16 LAB — GROUP A STREP BY PCR: Group A Strep by PCR: NOT DETECTED

## 2021-10-16 LAB — RESP PANEL BY RT-PCR (FLU A&B, COVID) ARPGX2
Influenza A by PCR: NEGATIVE
Influenza B by PCR: NEGATIVE
SARS Coronavirus 2 by RT PCR: NEGATIVE

## 2021-10-16 MED ORDER — ACETAMINOPHEN 500 MG PO TABS
1000.0000 mg | ORAL_TABLET | Freq: Once | ORAL | Status: AC
  Administered 2021-10-16: 1000 mg via ORAL
  Filled 2021-10-16: qty 2

## 2021-10-16 MED ORDER — DOXYCYCLINE HYCLATE 100 MG PO TABS
100.0000 mg | ORAL_TABLET | Freq: Once | ORAL | Status: AC
  Administered 2021-10-16: 100 mg via ORAL
  Filled 2021-10-16: qty 1

## 2021-10-16 MED ORDER — DOXYCYCLINE HYCLATE 100 MG PO TABS: 100.0000 mg | ORAL_TABLET | Freq: Two times a day (BID) | ORAL | 0 refills | Status: AC

## 2021-10-16 NOTE — Discharge Instructions (Addendum)
Take the antibiotic as prescribed.  Buy over-the-counter Mucinex or Delsym for cough.  Tylenol and ibuprofen for fever as needed.  Drink plenty of fluids.  Return emergency department worsening.  See your regular doctor if not improving in 4 to 5 days.

## 2021-10-16 NOTE — ED Provider Notes (Signed)
[facility] Provider Note    Event Date/Time   First MD Initiated Contact with Patient 10/16/21 2135     (approximate)   History   Sore Throat   HPI  Desiree Collins is a 36 y.o. female with history of vitamin D deficiency tachycardia asthma presents emergency department with complaints of fever, chills, body aches, sore throat and cough for 2 days.  Patient states she had to clean up with men's bathroom several times in his right that she has E. coli because of that.  She has had no vomiting or diarrhea      Physical Exam   Triage Vital Signs: ED Triage Vitals  Enc Vitals Group     BP 10/16/21 2031 (!) 151/109     Pulse Rate 10/16/21 2031 (!) 121     Resp 10/16/21 2031 20     Temp 10/16/21 2031 (!) 102.7 F (39.3 C)     Temp Source 10/16/21 2031 Oral     SpO2 10/16/21 2031 100 %     Weight 10/16/21 2032 196 lb (88.9 kg)     Height 10/16/21 2032 5\' 3"  (1.6 m)     Head Circumference --      Peak Flow --      Pain Score 10/16/21 2032 3     Pain Loc --      Pain Edu? --      Excl. in GC? --     Most recent vital signs: Vitals:   10/16/21 2031  BP: (!) 151/109  Pulse: (!) 121  Resp: 20  Temp: (!) 102.7 F (39.3 C)  SpO2: 100%     General: Awake, no distress.   CV:  Good peripheral perfusion.  Tachycardic Resp:  Normal effort. Lungs CTA Abd:  No distention.   Other:  Throat is minimally red   ED Results / Procedures / Treatments   Labs (all labs ordered are listed, but only abnormal results are displayed) Labs Reviewed  GROUP A STREP BY PCR  RESP PANEL BY RT-PCR (FLU A&B, COVID) [id number]     EKG     RADIOLOGY     PROCEDURES:   Procedures   MEDICATIONS ORDERED IN ED: Medications  doxycycline (VIBRA-TABS) tablet 100 mg (has no administration in time range)  acetaminophen (TYLENOL) tablet 1,000 mg (1,000 mg Oral Given 10/16/21 2036)     IMPRESSION / MDM / ASSESSMENT AND PLAN / ED COURSE  I reviewed the triage  vital signs and the nursing notes.                              Differential diagnosis includes, but is not limited to, COVID, influenza, strep throat, CAP, sepsis  Patient's presentation is most consistent with acute presentation with potential threat to life or bodily function.   Strep test negative, ordered COVID/flu test.  Chest x-ray  Chest x-ray reviewed and interpreted by me as having infiltrate right lower lobe.  Confirmed by radiology  Patient's respiratory panel for COVID and influenza is negative.  I did explain these findings to the patient.  She was given doxycycline here in the ED.  Prescription for doxycycline for 10 days.  Work note stating that she has pneumonia will need to stay out of work until she has been fever free for 48 hours.  Patient is in agreement treatment plan.  She was discharged stable condition.   FINAL CLINICAL IMPRESSION(S) / ED  DIAGNOSES   Final diagnoses:  Community acquired pneumonia of right lower lobe of lung     Rx / DC Orders   ED Discharge Orders          Ordered    doxycycline (VIBRA-TABS) 100 MG tablet  2 times daily        10/16/21 2243             Note:  This document was prepared using Dragon voice recognition software and may include unintentional dictation errors.    Faythe Ghee, PA-C 10/16/21 2250    Minna Antis, MD 10/16/21 2255

## 2021-10-16 NOTE — ED Triage Notes (Signed)
Pt reports fever, sore throat, cough. For 2 days.    Cig smoker.  Pt alert.
